# Patient Record
Sex: Male | Born: 1970 | Race: Asian | Hispanic: No | Marital: Single | Smoking: Former smoker
Health system: Southern US, Community
[De-identification: ages and names within clinical notes are randomized; demographics above are authoritative.]

## PROBLEM LIST (undated history)

## (undated) ENCOUNTER — Emergency Department (HOSPITAL_COMMUNITY): Admission: EM | Discharge status: Against Medical Advice | Payer: Self-pay

## (undated) ENCOUNTER — Emergency Department (HOSPITAL_COMMUNITY): Payer: Self-pay | Source: Home / Self Care

## (undated) DIAGNOSIS — Z789 Other specified health status: Secondary | ICD-10-CM

## (undated) DIAGNOSIS — N200 Calculus of kidney: Secondary | ICD-10-CM

## (undated) DIAGNOSIS — T7840XA Allergy, unspecified, initial encounter: Secondary | ICD-10-CM

## (undated) DIAGNOSIS — E785 Hyperlipidemia, unspecified: Secondary | ICD-10-CM

## (undated) DIAGNOSIS — I10 Essential (primary) hypertension: Secondary | ICD-10-CM

## (undated) HISTORY — DX: Essential (primary) hypertension: I10

## (undated) HISTORY — DX: Allergy, unspecified, initial encounter: T78.40XA

## (undated) HISTORY — DX: Calculus of kidney: N20.0

## (undated) HISTORY — PX: LITHOTRIPSY: SUR834

## (undated) HISTORY — DX: Hyperlipidemia, unspecified: E78.5

## (undated) HISTORY — PX: POLYPECTOMY: SHX149

---

## 2001-07-20 ENCOUNTER — Emergency Department (HOSPITAL_COMMUNITY): Admission: EM | Admit: 2001-07-20 | Discharge: 2001-07-21 | Payer: Self-pay | Admitting: Emergency Medicine

## 2009-04-08 ENCOUNTER — Emergency Department (HOSPITAL_COMMUNITY): Admission: EM | Admit: 2009-04-08 | Discharge: 2009-04-08 | Payer: Self-pay | Admitting: Emergency Medicine

## 2009-04-19 ENCOUNTER — Emergency Department (HOSPITAL_COMMUNITY): Admission: EM | Admit: 2009-04-19 | Discharge: 2009-04-20 | Payer: Self-pay | Admitting: Emergency Medicine

## 2009-05-22 ENCOUNTER — Emergency Department (HOSPITAL_COMMUNITY): Admission: EM | Admit: 2009-05-22 | Discharge: 2009-05-22 | Payer: Self-pay | Admitting: Emergency Medicine

## 2009-05-22 ENCOUNTER — Ambulatory Visit (HOSPITAL_COMMUNITY): Admission: RE | Admit: 2009-05-22 | Discharge: 2009-05-22 | Payer: Self-pay | Admitting: Urology

## 2009-05-30 ENCOUNTER — Ambulatory Visit (HOSPITAL_COMMUNITY): Admission: RE | Admit: 2009-05-30 | Discharge: 2009-05-30 | Payer: Self-pay | Admitting: Urology

## 2010-08-05 LAB — URINALYSIS, ROUTINE W REFLEX MICROSCOPIC
Bilirubin Urine: NEGATIVE
Glucose, UA: NEGATIVE mg/dL
Ketones, ur: NEGATIVE mg/dL
Leukocytes, UA: NEGATIVE
Nitrite: NEGATIVE
Protein, ur: NEGATIVE mg/dL
Specific Gravity, Urine: 1.018 (ref 1.005–1.030)
Urobilinogen, UA: 0.2 mg/dL (ref 0.0–1.0)
pH: 6 (ref 5.0–8.0)

## 2010-08-05 LAB — URINE MICROSCOPIC-ADD ON

## 2010-08-05 LAB — URINE CULTURE
Colony Count: NO GROWTH
Culture: NO GROWTH

## 2010-08-06 LAB — BASIC METABOLIC PANEL
BUN: 15 mg/dL (ref 6–23)
CO2: 25 meq/L (ref 19–32)
Calcium: 7.7 mg/dL — ABNORMAL LOW (ref 8.4–10.5)
Chloride: 109 meq/L (ref 96–112)
Creatinine, Ser: 0.72 mg/dL (ref 0.4–1.5)
GFR calc Af Amer: >60 mL/min (ref >60)
GFR calc non Af Amer: >60 mL/min (ref >60)
Glucose, Bld: 108 mg/dL — ABNORMAL HIGH (ref 70–99)
Potassium: 3.4 meq/L — ABNORMAL LOW (ref 3.5–5.1)
Sodium: 139 meq/L (ref 135–145)

## 2010-08-06 LAB — URINE MICROSCOPIC-ADD ON

## 2010-08-06 LAB — DIFFERENTIAL
Basophils Absolute: 0 10*3/uL (ref 0.0–0.1)
Basophils Relative: 0 % (ref 0–1)
Eosinophils Absolute: 0.2 10*3/uL (ref 0.0–0.7)
Eosinophils Relative: 5 % (ref 0–5)
Lymphocytes Relative: 27 % (ref 12–46)
Lymphs Abs: 1.2 10*3/uL (ref 0.7–4.0)
Monocytes Absolute: 0.4 10*3/uL (ref 0.1–1.0)
Monocytes Relative: 9 % (ref 3–12)
Neutro Abs: 2.6 10*3/uL (ref 1.7–7.7)
Neutrophils Relative %: 58 % (ref 43–77)

## 2010-08-06 LAB — URINALYSIS, ROUTINE W REFLEX MICROSCOPIC
Bilirubin Urine: NEGATIVE
Glucose, UA: NEGATIVE mg/dL
Ketones, ur: 15 mg/dL — AB
Leukocytes, UA: NEGATIVE
Nitrite: NEGATIVE
Protein, ur: 30 mg/dL — AB
Specific Gravity, Urine: 1.029 (ref 1.005–1.030)
Urobilinogen, UA: 1 mg/dL (ref 0.0–1.0)
pH: 6 (ref 5.0–8.0)

## 2010-08-06 LAB — CBC
HCT: 38.6 % — ABNORMAL LOW (ref 39.0–52.0)
Hemoglobin: 13.5 g/dL (ref 13.0–17.0)
MCHC: 34.9 g/dL (ref 30.0–36.0)
MCV: 94.3 fL (ref 78.0–100.0)
Platelets: 287 10*3/uL (ref 150–400)
RBC: 4.09 MIL/uL — ABNORMAL LOW (ref 4.22–5.81)
RDW: 13.4 % (ref 11.5–15.5)
WBC: 4.5 10*3/uL (ref 4.0–10.5)

## 2012-08-13 ENCOUNTER — Encounter: Payer: Self-pay | Admitting: Obstetrics and Gynecology

## 2013-02-20 ENCOUNTER — Encounter (HOSPITAL_COMMUNITY): Payer: Self-pay | Admitting: Emergency Medicine

## 2013-02-20 ENCOUNTER — Observation Stay (HOSPITAL_COMMUNITY)
Admission: EM | Admit: 2013-02-20 | Discharge: 2013-02-21 | Disposition: A | Discharge status: Home / Self Care | Payer: BC Managed Care – PPO | Attending: Internal Medicine | Admitting: Internal Medicine

## 2013-02-20 ENCOUNTER — Emergency Department (HOSPITAL_COMMUNITY): Payer: BC Managed Care – PPO

## 2013-02-20 DIAGNOSIS — Z23 Encounter for immunization: Secondary | ICD-10-CM | POA: Insufficient documentation

## 2013-02-20 DIAGNOSIS — R0602 Shortness of breath: Secondary | ICD-10-CM | POA: Insufficient documentation

## 2013-02-20 DIAGNOSIS — R6510 Systemic inflammatory response syndrome (SIRS) of non-infectious origin without acute organ dysfunction: Secondary | ICD-10-CM

## 2013-02-20 DIAGNOSIS — R002 Palpitations: Secondary | ICD-10-CM | POA: Diagnosis present

## 2013-02-20 DIAGNOSIS — R55 Syncope and collapse: Secondary | ICD-10-CM | POA: Insufficient documentation

## 2013-02-20 DIAGNOSIS — E876 Hypokalemia: Secondary | ICD-10-CM | POA: Insufficient documentation

## 2013-02-20 DIAGNOSIS — D72829 Elevated white blood cell count, unspecified: Secondary | ICD-10-CM | POA: Insufficient documentation

## 2013-02-20 DIAGNOSIS — F10929 Alcohol use, unspecified with intoxication, unspecified: Secondary | ICD-10-CM

## 2013-02-20 DIAGNOSIS — R079 Chest pain, unspecified: Principal | ICD-10-CM | POA: Insufficient documentation

## 2013-02-20 HISTORY — DX: Other specified health status: Z78.9

## 2013-02-20 LAB — CBC
HCT: 40.3 % (ref 39.0–52.0)
Hemoglobin: 14.8 g/dL (ref 13.0–17.0)
MCH: 31.7 pg (ref 26.0–34.0)
MCHC: 36.7 g/dL — ABNORMAL HIGH (ref 30.0–36.0)
MCV: 86.3 fL (ref 78.0–100.0)
Platelets: 363 10*3/uL (ref 150–400)
RBC: 4.67 MIL/uL (ref 4.22–5.81)
RDW: 12.5 % (ref 11.5–15.5)
WBC: 13.7 10*3/uL — ABNORMAL HIGH (ref 4.0–10.5)

## 2013-02-20 NOTE — ED Notes (Signed)
Pt. reports left chest pain with SOB , occasional dry cough and nausea onset this evening , drank ETOH this evening .

## 2013-02-21 ENCOUNTER — Encounter (HOSPITAL_COMMUNITY): Payer: Self-pay | Admitting: Internal Medicine

## 2013-02-21 DIAGNOSIS — R002 Palpitations: Secondary | ICD-10-CM | POA: Diagnosis present

## 2013-02-21 DIAGNOSIS — R55 Syncope and collapse: Secondary | ICD-10-CM | POA: Diagnosis present

## 2013-02-21 DIAGNOSIS — R079 Chest pain, unspecified: Principal | ICD-10-CM | POA: Diagnosis present

## 2013-02-21 DIAGNOSIS — R072 Precordial pain: Secondary | ICD-10-CM

## 2013-02-21 DIAGNOSIS — F1010 Alcohol abuse, uncomplicated: Secondary | ICD-10-CM

## 2013-02-21 DIAGNOSIS — E876 Hypokalemia: Secondary | ICD-10-CM

## 2013-02-21 LAB — CBC WITH DIFFERENTIAL/PLATELET
Basophils Absolute: 0 10*3/uL (ref 0.0–0.1)
Basophils Relative: 0 % (ref 0–1)
Eosinophils Absolute: 0.2 10*3/uL (ref 0.0–0.7)
Eosinophils Relative: 2 % (ref 0–5)
HCT: 36.2 % — ABNORMAL LOW (ref 39.0–52.0)
Hemoglobin: 13.1 g/dL (ref 13.0–17.0)
Lymphocytes Relative: 25 % (ref 12–46)
Lymphs Abs: 2.3 10*3/uL (ref 0.7–4.0)
MCH: 31.4 pg (ref 26.0–34.0)
MCHC: 36.2 g/dL — ABNORMAL HIGH (ref 30.0–36.0)
MCV: 86.8 fL (ref 78.0–100.0)
Monocytes Absolute: 0.6 10*3/uL (ref 0.1–1.0)
Monocytes Relative: 6 % (ref 3–12)
Neutro Abs: 6 10*3/uL (ref 1.7–7.7)
Neutrophils Relative %: 66 % (ref 43–77)
Platelets: 292 10*3/uL (ref 150–400)
RBC: 4.17 MIL/uL — ABNORMAL LOW (ref 4.22–5.81)
RDW: 12.8 % (ref 11.5–15.5)
WBC: 9 10*3/uL (ref 4.0–10.5)

## 2013-02-21 LAB — RAPID URINE DRUG SCREEN, HOSP PERFORMED
Amphetamines: NOT DETECTED
Barbiturates: NOT DETECTED
Benzodiazepines: NOT DETECTED
Cocaine: NOT DETECTED
Opiates: POSITIVE — AB
Tetrahydrocannabinol: NOT DETECTED

## 2013-02-21 LAB — TROPONIN I
Troponin I: <0.3 ng/mL (ref <0.30)
Troponin I: <0.3 ng/mL (ref <0.30)
Troponin I: <0.3 ng/mL (ref <0.30)
Troponin I: <0.3 ng/mL (ref <0.30)

## 2013-02-21 LAB — URINALYSIS, ROUTINE W REFLEX MICROSCOPIC
Bilirubin Urine: NEGATIVE
Glucose, UA: NEGATIVE mg/dL
Hgb urine dipstick: NEGATIVE
Ketones, ur: 15 mg/dL — AB
Leukocytes, UA: NEGATIVE
Nitrite: NEGATIVE
Protein, ur: NEGATIVE mg/dL
Specific Gravity, Urine: 1.014 (ref 1.005–1.030)
Urobilinogen, UA: 0.2 mg/dL (ref 0.0–1.0)
pH: 6 (ref 5.0–8.0)

## 2013-02-21 LAB — D-DIMER, QUANTITATIVE: D-Dimer, Quant: <0.27 ug{FEU}/mL (ref 0.00–0.48)

## 2013-02-21 LAB — TSH: TSH: 0.819 u[IU]/mL (ref 0.350–4.500)

## 2013-02-21 LAB — BASIC METABOLIC PANEL
BUN: 21 mg/dL (ref 6–23)
CO2: 22 meq/L (ref 19–32)
Calcium: 8.9 mg/dL (ref 8.4–10.5)
Chloride: 100 meq/L (ref 96–112)
Creatinine, Ser: 1.09 mg/dL (ref 0.50–1.35)
GFR calc Af Amer: >90 mL/min (ref >90)
GFR calc non Af Amer: 81 mL/min — ABNORMAL LOW (ref >90)
Glucose, Bld: 133 mg/dL — ABNORMAL HIGH (ref 70–99)
Potassium: 3 meq/L — ABNORMAL LOW (ref 3.5–5.1)
Sodium: 138 meq/L (ref 135–145)

## 2013-02-21 LAB — PRO B NATRIURETIC PEPTIDE: Pro B Natriuretic peptide (BNP): 26.7 pg/mL (ref 0–125)

## 2013-02-21 LAB — COMPREHENSIVE METABOLIC PANEL
ALT: 22 U/L (ref 0–53)
AST: 19 U/L (ref 0–37)
Albumin: 3.3 g/dL — ABNORMAL LOW (ref 3.5–5.2)
Alkaline Phosphatase: 77 U/L (ref 39–117)
BUN: 17 mg/dL (ref 6–23)
CO2: 20 meq/L (ref 19–32)
Calcium: 8.1 mg/dL — ABNORMAL LOW (ref 8.4–10.5)
Chloride: 104 meq/L (ref 96–112)
Creatinine, Ser: 0.8 mg/dL (ref 0.50–1.35)
GFR calc Af Amer: >90 mL/min (ref >90)
GFR calc non Af Amer: >90 mL/min (ref >90)
Glucose, Bld: 102 mg/dL — ABNORMAL HIGH (ref 70–99)
Potassium: 3.5 meq/L (ref 3.5–5.1)
Sodium: 137 meq/L (ref 135–145)
Total Bilirubin: 0.3 mg/dL (ref 0.3–1.2)
Total Protein: 6.3 g/dL (ref 6.0–8.3)

## 2013-02-21 LAB — ETHANOL: Alcohol, Ethyl (B): 119 mg/dL — ABNORMAL HIGH (ref 0–11)

## 2013-02-21 LAB — MAGNESIUM: Magnesium: 1.7 mg/dL (ref 1.5–2.5)

## 2013-02-21 LAB — CG4 I-STAT (LACTIC ACID): Lactic Acid, Venous: 2.35 mmol/L — ABNORMAL HIGH (ref 0.5–2.2)

## 2013-02-21 LAB — LACTIC ACID, PLASMA: Lactic Acid, Venous: 1.9 mmol/L (ref 0.5–2.2)

## 2013-02-21 LAB — LIPASE, BLOOD: Lipase: 28 U/L (ref 11–59)

## 2013-02-21 MED ORDER — POTASSIUM CHLORIDE IN NACL 20-0.9 MEQ/L-% IV SOLN
INTRAVENOUS | Status: DC
  Administered 2013-02-21: 04:00:00 via INTRAVENOUS
  Filled 2013-02-21 (×2): qty 1000

## 2013-02-21 MED ORDER — POTASSIUM CHLORIDE CRYS ER 20 MEQ PO TBCR
40.0000 meq | EXTENDED_RELEASE_TABLET | Freq: Once | ORAL | Status: AC
  Administered 2013-02-21: 40 meq via ORAL
  Filled 2013-02-21: qty 2

## 2013-02-21 MED ORDER — INFLUENZA VAC SPLIT QUAD 0.5 ML IM SUSP: 0.5000 mL | INTRAMUSCULAR | Status: DC

## 2013-02-21 MED ORDER — ONDANSETRON HCL 4 MG/2ML IJ SOLN: 4.0000 mg | Freq: Four times a day (QID) | INTRAMUSCULAR | Status: DC | PRN

## 2013-02-21 MED ORDER — ACETAMINOPHEN 325 MG PO TABS: 650.0000 mg | ORAL_TABLET | Freq: Four times a day (QID) | ORAL | Status: AC | PRN

## 2013-02-21 MED ORDER — INFLUENZA VAC SPLIT QUAD 0.5 ML IM SUSP
0.5000 mL | INTRAMUSCULAR | Status: AC
  Administered 2013-02-21: 0.5 mL via INTRAMUSCULAR
  Filled 2013-02-21: qty 0.5

## 2013-02-21 MED ORDER — ONDANSETRON HCL 4 MG PO TABS: 4.0000 mg | ORAL_TABLET | Freq: Four times a day (QID) | ORAL | Status: DC | PRN

## 2013-02-21 MED ORDER — POTASSIUM CHLORIDE ER 10 MEQ PO TBCR: 20.0000 meq | EXTENDED_RELEASE_TABLET | Freq: Every day | ORAL | Status: DC

## 2013-02-21 MED ORDER — ASPIRIN EC 325 MG PO TBEC
325.0000 mg | DELAYED_RELEASE_TABLET | Freq: Every day | ORAL | Status: DC
  Administered 2013-02-21: 325 mg via ORAL
  Filled 2013-02-21: qty 1

## 2013-02-21 MED ORDER — ASPIRIN EC 81 MG PO TBEC: 81.0000 mg | DELAYED_RELEASE_TABLET | Freq: Every day | ORAL | Status: DC

## 2013-02-21 MED ORDER — ACETAMINOPHEN 650 MG RE SUPP: 650.0000 mg | Freq: Four times a day (QID) | RECTAL | Status: DC | PRN

## 2013-02-21 MED ORDER — MORPHINE SULFATE 2 MG/ML IJ SOLN
2.0000 mg | Freq: Once | INTRAMUSCULAR | Status: AC
  Administered 2013-02-21: 2 mg via INTRAVENOUS
  Filled 2013-02-21: qty 1

## 2013-02-21 MED ORDER — SODIUM CHLORIDE 0.9 % IV BOLUS (SEPSIS)
1000.0000 mL | Freq: Once | INTRAVENOUS | Status: AC
  Administered 2013-02-21: 1000 mL via INTRAVENOUS

## 2013-02-21 MED ORDER — ASPIRIN 81 MG PO CHEW
324.0000 mg | CHEWABLE_TABLET | Freq: Once | ORAL | Status: AC
  Administered 2013-02-21: 324 mg via ORAL
  Filled 2013-02-21: qty 4

## 2013-02-21 MED ORDER — LORATADINE 10 MG PO TABS
10.0000 mg | ORAL_TABLET | Freq: Every day | ORAL | Status: DC
  Administered 2013-02-21: 10 mg via ORAL
  Filled 2013-02-21: qty 1

## 2013-02-21 MED ORDER — SODIUM CHLORIDE 0.9 % IJ SOLN
3.0000 mL | Freq: Two times a day (BID) | INTRAMUSCULAR | Status: DC
  Administered 2013-02-21 (×2): 3 mL via INTRAVENOUS

## 2013-02-21 MED ORDER — ACETAMINOPHEN 325 MG PO TABS: 650.0000 mg | ORAL_TABLET | Freq: Four times a day (QID) | ORAL | Status: DC | PRN

## 2013-02-21 MED ORDER — MORPHINE SULFATE 2 MG/ML IJ SOLN: 1.0000 mg | INTRAMUSCULAR | Status: DC | PRN

## 2013-02-21 NOTE — ED Notes (Signed)
Transporting patient to new room assignment. 

## 2013-02-21 NOTE — Progress Notes (Signed)
  Echocardiogram 2D Echocardiogram has been performed.  Cathie Beams 02/21/2013, 12:50 PM

## 2013-02-21 NOTE — H&P (Signed)
[facility] History and Physical  Todd Clayton WUJ:[id number] DOB: 1970/12/13 DOA: 02/20/2013  Referring physician: ER physician. PCP: No primary provider on file.   Chief Complaint: Chest pain and loss of consciousness.  HPI: Todd Clayton is a 42 y.o. male with no significant past history was brought to the ER after patient had a spell of loss of consciousness. Patient last night was having alcohol with his friends after the friends left patient had complained of chest pain to his wife. The pain was stabbing in nature radiating to the back. He asked his wife to bring some water and when she went to get the water and came back she found that patient was unconscious. She immediately get some chest compressions and gave some mouth-to-mouth respirations. EMS was called and by then patient regained consciousness. Patient presently chest pain-free. Cardiac markers EKG and chest x-ray were unremarkable. Since patient had this brief episode of chest pain followed by loss of consciousness and brief CPR done by his wife patient has been admitted for observation. Patient denies any nausea vomiting abdominal pain diarrhea focal deficits headache visual symptoms. Patient has not had similar symptoms previously. Patient states that he does not drink alcohol everyday.   Review of Systems: As presented in the history of presenting illness, rest negative.  Past Medical History  Diagnosis Date  . Medical history non-contributory    Past Surgical History  Procedure Laterality Date  . Lithotripsy     Social History:  reports that he has never smoked. He does not have any smokeless tobacco history on file. He reports that he drinks alcohol. He reports that he does not use illicit drugs. Where does patient live home. Can patient participate in ADLs? Yes.  No Known Allergies  Family History:  Family History  Problem Relation Age of Onset  . Cervical cancer Mother       Prior to  Admission medications   Medication Sig Start Date End Date Taking? Authorizing Provider  cetirizine (ZYRTEC) 10 MG tablet Take 10 mg by mouth daily as needed for allergies.   Yes Historical Provider, MD    Physical Exam: Filed Vitals:   02/21/13 0100 02/21/13 0115 02/21/13 0130 02/21/13 0217  BP: 120/77 122/81 127/82 113/74  Pulse: 95 96 100 95  Temp:    98.2 F (36.8 C)  TempSrc:    Oral  Resp: 23 18 21    Height:    5\' 7"  (1.702 m)  Weight:    77.9 kg (171 lb 11.8 oz)  SpO2: 97% 99% 96% 93%     General:  Well-developed and moderately nourished.  Eyes: Anicteric no pallor.  ENT: No discharge from ears eyes nose mouth.  Neck: No mass felt.  Cardiovascular: S1-S2 heard.  Respiratory: No rhonchi or crepitations.  Abdomen: Soft nontender bowel sounds present.  Skin: No rash.  Musculoskeletal: No edema.  Psychiatric: Appears normal.  Neurologic: Alert awake oriented to time place and person. Moves all extremities.  Labs on Admission:  Basic Metabolic Panel:  Recent Labs Lab 02/20/13 2316  NA 138  K 3.0*  CL 100  CO2 22  GLUCOSE 133*  BUN 21  CREATININE 1.09  CALCIUM 8.9   Liver Function Tests: No results found for this basename: AST, ALT, ALKPHOS, BILITOT, PROT, ALBUMIN,  in the last 168 hours No results found for this basename: LIPASE, AMYLASE,  in the last 168 hours No results found for this basename: AMMONIA,  in the last 168 hours CBC:  Recent Labs Lab 02/20/13 2316  WBC 13.7*  HGB 14.8  HCT 40.3  MCV 86.3  PLT 363   Cardiac Enzymes:  Recent Labs Lab 02/20/13 2317  TROPONINI <0.30    BNP (last 3 results)  Recent Labs  02/20/13 2317  PROBNP 26.7   CBG: No results found for this basename: GLUCAP,  in the last 168 hours  Radiological Exams on Admission: Dg Chest 2 View  02/20/2013   CLINICAL DATA:  Chest pain  EXAM: CHEST  2 VIEW  COMPARISON:  None.  FINDINGS: The heart size and mediastinal contours are within normal limits.  Both lungs are clear. The visualized skeletal structures are unremarkable.  IMPRESSION: No active cardiopulmonary disease.   Electronically Signed   By: Todd Clayton M.D.   On: 02/20/2013 23:51    EKG: Independently reviewed. Sinus tachycardia.  Assessment/Plan Principal Problem:   Chest pain   1. Chest pain with syncopal episode - closely monitor in telemetry. Check 2-D echo. Replace potassium and check magnesium levels. Cycle cardiac markers. 2. Mild hypokalemia - replace and recheck. 3. Leukocytosis - probably reactionary. Recheck CBC. Check UA.    Code Status: Full code.  Family Communication: Patient wife at the bedside.  Disposition Plan: Admit for observation.    Todd Clayton N. [facility] Pager [phone].  If 7PM-7AM, please contact night-coverage [email or web address] Password [email or web address] 02/21/2013, 3:04 AM

## 2013-02-21 NOTE — Discharge Summary (Signed)
Physician Discharge Summary  Todd Clayton UJW:[id number] DOB: [birth year] DOA: 02/20/2013  PCP: No primary provider on file.  Admit date: 02/20/2013 Discharge date: 02/21/2013   Recommendations for Outpatient Follow-up:  1. F/u cardiology to consider event monitor and stress test  Discharge Diagnoses:  Principal Problem:   Chest pain hypokalemia  Discharge Condition: stable  Filed Weights   02/21/13 0217  Weight: 77.9 kg (171 lb 11.8 oz)    History of present illness:  42 y.o. male with no significant past history was brought to the ER after patient had a spell of loss of consciousness. Patient last night was having alcohol with his friends after the friends left patient had complained of chest pain to his wife. The pain was stabbing in nature radiating to the back. He asked his wife to bring some water and when she went to get the water and came back she found that patient was unconscious. She immediately get some chest compressions and gave some mouth-to-mouth respirations. EMS was called and by then patient regained consciousness. Patient presently chest pain-free. Cardiac markers EKG and chest x-ray were unremarkable. Since patient had this brief episode of chest pain followed by loss of consciousness and brief CPR done by his wife patient has been admitted for observation. Patient denies any nausea vomiting abdominal pain diarrhea focal deficits headache visual symptoms. Patient has not had similar symptoms previously. Patient states that he does not drink alcohol everyday.   Hospital Course:  Observed on telemetry where he remained in NSR.  No further chest pain or syncopal episodes.  Echocardiogram showed only grade 1 diastolic dysfunction. Hypokalemia corected. MI ruled out. Have arranged f/u with cardiology as outpatient to consider stress test and event monitor  Procedures:  none  Consultations:  none  Discharge Exam: Filed Vitals:   02/21/13 0553  BP: 119/81   Pulse: 93  Temp: 97.6 F (36.4 C)  Resp: 16    General: alert. anxious Cardiovascular: RRR without MGR Respiratory: CTA without WRR  Discharge Instructions  Discharge Orders   Future Orders Complete By Expires   Activity as tolerated - No restrictions  As directed    Diet general  As directed        Medication List         acetaminophen 325 MG tablet  Commonly known as:  TYLENOL  Take 2 tablets (650 mg total) by mouth every 6 (six) hours as needed.     aspirin EC 81 MG tablet  Take 1 tablet (81 mg total) by mouth daily.     cetirizine 10 MG tablet  Commonly known as:  ZYRTEC  Take 10 mg by mouth daily as needed for allergies.     potassium chloride 10 MEQ tablet  Commonly known as:  K-DUR  Take 2 tablets (20 mEq total) by mouth daily.       No Known Allergies     Follow-up Information   Follow up with [facility]. (the office will call you with appointment time)    Contact information:   [address] [place] [address]        The results of significant diagnostics from this hospitalization (including imaging, microbiology, ancillary and laboratory) are listed below for reference.    Significant Diagnostic Studies: Dg Chest 2 View  02/20/2013   CLINICAL DATA:  Chest pain  EXAM: CHEST  2 VIEW  COMPARISON:  None.  FINDINGS: The heart size and mediastinal contours are within  normal limits. Both lungs are clear. The visualized skeletal structures are unremarkable.  IMPRESSION: No active cardiopulmonary disease.   Electronically Signed   By: Marlan Palau M.D.   On: 02/20/2013 23:51    Microbiology: No results found for this or any previous visit (from the past 240 hour(s)).   Labs: Basic Metabolic Panel:  Recent Labs Lab 02/20/13 2316 02/21/13 0500  NA 138 137  K 3.0* 3.5  CL 100 104  CO2 22 20  GLUCOSE 133* 102*  BUN 21 17  CREATININE 1.09 0.80  CALCIUM 8.9 8.1*  MG  --  1.7    Liver Function Tests:  Recent Labs Lab 02/21/13 0500  AST 19  ALT 22  ALKPHOS 77  BILITOT 0.3  PROT 6.3  ALBUMIN 3.3*    Recent Labs Lab 02/21/13 0500  LIPASE 28   No results found for this basename: AMMONIA,  in the last 168 hours CBC:  Recent Labs Lab 02/20/13 2316 02/21/13 0500  WBC 13.7* 9.0  NEUTROABS  --  6.0  HGB 14.8 13.1  HCT 40.3 36.2*  MCV 86.3 86.8  PLT 363 292   Cardiac Enzymes:  Recent Labs Lab 02/20/13 2317 02/21/13 0500 02/21/13 0840  TROPONINI <0.30 <0.30 <0.30   BNP: BNP (last 3 results)  Recent Labs  02/20/13 2317  PROBNP 26.7   CBG: No results found for this basename: GLUCAP,  in the last 168 hours  Echo Left ventricle: The cavity size was normal. Systolic function was normal. The estimated ejection fraction was in the range of 55% to 60%. Wall motion was normal; there were no regional wall motion abnormalities. Doppler parameters are consistent with abnormal left ventricular relaxation (grade 1 diastolic dysfunction).   EKG Sinus tachycardia  Signed:  Zaylei Mullane L  [facility] 02/21/2013, 2:18 PM

## 2013-02-21 NOTE — ED Provider Notes (Signed)
CSN: [id number]     Arrival date & time 02/20/13  2246 History   First MD Initiated Contact with Patient 02/21/13 0004     Chief Complaint  Patient presents with  . Chest Pain   (Consider location/radiation/quality/duration/timing/severity/associated sxs/prior Treatment) HPI This patient is a generally healthy 42 yo man who presents after an episode of unresponsiveness preceded by chest pain. The patient has had intermittent chest pain throughout the day. Pain is aching, "feels like someone is punching me - inside of me. I feel like an elephant sit on my body".  Sx come and go. Pain radiates to left upper back. 10/10 at great severity. Currently, the patient is chest pain free. But, he has 6/10 left upper back pain.   Patient notes that he has had this pain in the back for the past week. He has been taking Alleve and the pain comes and goes. It is worse with use of the left arm. He is left hand dominant. Patient has SOB associated with chest pain.   His wife says that he was laying on bed at 2030 tonight, complained of chest pain then "stopped breathing".  She describes the patient stopped breathing, eyes open, extremities flacid, patient unresponsive, skin pale. Wife did chest compressions and mouth to mouth resuscitation. This happened for about 5 minutes. Wife says he started coughing. Paramedics arrived and "he slowly waked up".   No history of similar sx. No history of chest pain. No history of tobacco use.   The patient's brother died at age 3 of cardiac arrest.  He is the youngest of 53 children and one of only two surviving. Sister thinks that the patient also has a second brother who has a history of CAD.  The patient is a [place] immigrant. He has no primary care physician. He has never had a cardiac work up.    History reviewed. No pertinent past medical history. History reviewed. No pertinent past surgical history. No family history on file. History  Substance Use Topics  .  Smoking status: Never Smoker   . Smokeless tobacco: Not on file  . Alcohol Use: Yes    Review of Systems 10 point ROS completed and is negative with the exception of sx noted above.   Allergies  Review of patient's allergies indicates no known allergies.  Home Medications   Current Outpatient Rx  Name  Route  Sig  Dispense  Refill  . cetirizine (ZYRTEC) 10 MG tablet   Oral   Take 10 mg by mouth daily as needed for allergies.          BP 115/73  Pulse 118  Temp(Src) 98 F (36.7 C) (Oral)  Resp 22  SpO2 98% Physical Exam Gen: well developed and well nourished appearing Head: NCAT Eyes: PERL, EOMI Nose: no epistaixis or rhinorrhea Mouth/throat: mucosa is moist and pink Neck: supple, no stridor Lungs: CTA B, no wheezing, rhonchi or rales CV: Regular rate and rhythm, pulse in the 90s, no murmurs, extremities well perfused Abd: soft, notender, nondistended Back: There is palpation of paraspinal musculature left upper thoracic region, no line ttp, no cva ttp Skin: no rashese, wnl Neuro: CN ii-xii grossly intact, no focal deficits Psyche; somewhat withdrawn affect,  calm and cooperative.   ED Course  Procedures (including critical care time) Labs Review  Results for orders placed during the hospital encounter of 02/20/13 (from the past 24 hour(s))  CBC     Status: Abnormal   Collection Time  02/20/13 11:16 PM      Result Value Range   WBC 13.7 (*) 4.0 - 10.5 K/uL   RBC 4.67  4.22 - 5.81 MIL/uL   Hemoglobin 14.8  13.0 - 17.0 g/dL   HCT 45.4  09.8 - 11.9 %   MCV 86.3  78.0 - 100.0 fL   MCH 31.7  26.0 - 34.0 pg   MCHC 36.7 (*) 30.0 - 36.0 g/dL   RDW 14.7  82.9 - 56.2 %   Platelets 363  150 - 400 K/uL  BASIC METABOLIC PANEL     Status: Abnormal   Collection Time    02/20/13 11:16 PM      Result Value Range   Sodium 138  135 - 145 mEq/L   Potassium 3.0 (*) 3.5 - 5.1 mEq/L   Chloride 100  96 - 112 mEq/L   CO2 22  19 - 32 mEq/L   Glucose, Bld 133 (*) 70 - 99  mg/dL   BUN 21  6 - 23 mg/dL   Creatinine, Ser 1.30  0.50 - 1.35 mg/dL   Calcium 8.9  8.4 - 86.5 mg/dL   GFR calc non Af Amer 81 (*) >90 mL/min   GFR calc Af Amer >90  >90 mL/min  ETHANOL     Status: Abnormal   Collection Time    02/20/13 11:16 PM      Result Value Range   Alcohol, Ethyl (B) 119 (*) 0 - 11 mg/dL  PRO B NATRIURETIC PEPTIDE     Status: None   Collection Time    02/20/13 11:17 PM      Result Value Range   Pro B Natriuretic peptide (BNP) 26.7  0 - 125 pg/mL  TROPONIN I     Status: None   Collection Time    02/20/13 11:17 PM      Result Value Range   Troponin I <0.30  <0.30 ng/mL    Imaging Review Dg Chest 2 View  02/20/2013   CLINICAL DATA:  Chest pain  EXAM: CHEST  2 VIEW  COMPARISON:  None.  FINDINGS: The heart size and mediastinal contours are within normal limits. Both lungs are clear. The visualized skeletal structures are unremarkable.  IMPRESSION: No active cardiopulmonary disease.   Electronically Signed   By: Marlan Palau M.D.   On: 02/20/2013 23:51    EKG Interpretation   None      Sinus tach, normal axis, normal intervals, normal qrs, no acute icshemic changes noted  MDM  DDX: ACS, pneumothorax, pneumonia, pericardial or pleural effusion, gastritis, GERD/PUD, musculoskeletal pain.   Emergency department work up is thus far nondiagnostic but, reassuring from a cardiac perspective.  First troponin is negative - 4 hrs after event concerning to wife for cardiac arrest. Patient initially mildly tachycardic - improving with IVF and MS 2mg  IV for pain. Tx with ASA. D-dimer pending to help exclude PE. CXR excludes ptx, pleural effusion, pna. Abdominal exam is benign. We are treating hypokalemia.   Case discussed with Dr. Toniann Fail who has accepted the patient as an observation admission.     Todd Loosen, MD 02/21/13 0111

## 2015-03-16 ENCOUNTER — Emergency Department (HOSPITAL_COMMUNITY)
Admission: EM | Admit: 2015-03-16 | Discharge: 2015-03-17 | Disposition: A | Discharge status: Home / Self Care | Payer: BLUE CROSS/BLUE SHIELD | Attending: Emergency Medicine | Admitting: Emergency Medicine

## 2015-03-16 DIAGNOSIS — Z7982 Long term (current) use of aspirin: Secondary | ICD-10-CM | POA: Insufficient documentation

## 2015-03-16 DIAGNOSIS — N200 Calculus of kidney: Secondary | ICD-10-CM | POA: Insufficient documentation

## 2015-03-16 DIAGNOSIS — Z79891 Long term (current) use of opiate analgesic: Secondary | ICD-10-CM | POA: Insufficient documentation

## 2015-03-16 DIAGNOSIS — IMO0001 Reserved for inherently not codable concepts without codable children: Secondary | ICD-10-CM

## 2015-03-16 DIAGNOSIS — K5900 Constipation, unspecified: Secondary | ICD-10-CM | POA: Diagnosis not present

## 2015-03-16 DIAGNOSIS — Z79899 Other long term (current) drug therapy: Secondary | ICD-10-CM | POA: Insufficient documentation

## 2015-03-16 DIAGNOSIS — R1030 Lower abdominal pain, unspecified: Secondary | ICD-10-CM | POA: Diagnosis present

## 2015-03-16 HISTORY — DX: Calculus of kidney: N20.0

## 2015-03-17 ENCOUNTER — Emergency Department (HOSPITAL_COMMUNITY): Payer: BLUE CROSS/BLUE SHIELD

## 2015-03-17 ENCOUNTER — Encounter (HOSPITAL_COMMUNITY): Payer: Self-pay | Admitting: Emergency Medicine

## 2015-03-17 LAB — URINALYSIS, ROUTINE W REFLEX MICROSCOPIC
Bilirubin Urine: NEGATIVE
Glucose, UA: NEGATIVE mg/dL
Ketones, ur: NEGATIVE mg/dL
Nitrite: NEGATIVE
Protein, ur: 30 mg/dL — AB
Specific Gravity, Urine: 1.028 (ref 1.005–1.030)
Urobilinogen, UA: 0.2 mg/dL (ref 0.0–1.0)
pH: 5.5 (ref 5.0–8.0)

## 2015-03-17 LAB — I-STAT CHEM 8, ED
BUN: 17 mg/dL (ref 6–20)
Calcium, Ion: 1.11 mmol/L — ABNORMAL LOW (ref 1.12–1.23)
Chloride: 98 mmol/L — ABNORMAL LOW (ref 101–111)
Creatinine, Ser: 1.2 mg/dL (ref 0.61–1.24)
Glucose, Bld: 111 mg/dL — ABNORMAL HIGH (ref 65–99)
HCT: 42 % (ref 39.0–52.0)
Hemoglobin: 14.3 g/dL (ref 13.0–17.0)
Potassium: 3.6 mmol/L (ref 3.5–5.1)
Sodium: 135 mmol/L (ref 135–145)
TCO2: 24 mmol/L (ref 0–100)

## 2015-03-17 LAB — URINE MICROSCOPIC-ADD ON

## 2015-03-17 LAB — CBC WITH DIFFERENTIAL/PLATELET
Basophils Absolute: 0 K/uL (ref 0.0–0.1)
Basophils Relative: 0 %
Eosinophils Absolute: 0.1 K/uL (ref 0.0–0.7)
Eosinophils Relative: 2 %
HCT: 39 % (ref 39.0–52.0)
Hemoglobin: 13.8 g/dL (ref 13.0–17.0)
Lymphocytes Relative: 20 %
Lymphs Abs: 1.5 K/uL (ref 0.7–4.0)
MCH: 30.8 pg (ref 26.0–34.0)
MCHC: 35.4 g/dL (ref 30.0–36.0)
MCV: 87.1 fL (ref 78.0–100.0)
Monocytes Absolute: 0.6 K/uL (ref 0.1–1.0)
Monocytes Relative: 8 %
Neutro Abs: 5.2 K/uL (ref 1.7–7.7)
Neutrophils Relative %: 70 %
Platelets: 277 K/uL (ref 150–400)
RBC: 4.48 MIL/uL (ref 4.22–5.81)
RDW: 12.4 % (ref 11.5–15.5)
WBC: 7.4 K/uL (ref 4.0–10.5)

## 2015-03-17 MED ORDER — KETOROLAC TROMETHAMINE 30 MG/ML IJ SOLN
30.0000 mg | Freq: Once | INTRAMUSCULAR | Status: AC
  Administered 2015-03-17: 30 mg via INTRAVENOUS
  Filled 2015-03-17: qty 1

## 2015-03-17 MED ORDER — MELOXICAM 7.5 MG PO TABS: 7.5000 mg | ORAL_TABLET | Freq: Every day | ORAL | Status: DC

## 2015-03-17 MED ORDER — KETOROLAC TROMETHAMINE 60 MG/2ML IM SOLN: 60.0000 mg | Freq: Once | INTRAMUSCULAR | Status: DC

## 2015-03-17 MED ORDER — SODIUM CHLORIDE 0.9 % IV BOLUS (SEPSIS)
500.0000 mL | Freq: Once | INTRAVENOUS | Status: AC
  Administered 2015-03-17: 500 mL via INTRAVENOUS

## 2015-03-17 NOTE — ED Provider Notes (Signed)
CSN: [id number]     Arrival date & time 03/16/15  2350 History  By signing my name below, I, Emmanuella Mensah, attest that this documentation has been prepared under the direction and in the presence of Dearra Myhand, MD. Electronically Signed: Angelene Giovanni, ED Scribe. 03/17/2015. 3:28 AM.     Chief Complaint  Patient presents with  . Flank Pain    urine retention   Patient is a 44 y.o. male presenting with flank pain. The history is provided by the patient. No language interpreter was used.  Flank Pain This is a new problem. The current episode started more than 1 week ago. The problem occurs constantly. The problem has been gradually worsening. Associated symptoms include abdominal pain. Nothing aggravates the symptoms. Nothing relieves the symptoms. Treatments tried: Oxycodone and Cipro. The treatment provided no relief.   HPI Comments: Level 5 Caveat due to poor historian Todd Clayton is a 44 y.o. male with a hx of kidney stones who presents to the Emergency Department complaining of dysuria and gradually worsening right lower back pain onset 02/24/15. He reports associated lower abdominal pain and constipation. He denies any fever, chills, or n/v/d. He reports that his last void was about 5 hours ago. He reports that he went to a hospital in [place], [place] on 02/24/15 and diagnosed with kidney stones. He was then advised to follow up with a Urologist but has not followed up yet but adds that he has an upcoming appointment. He states that he went to Urgent Care where he received Cipro on 03/06/15. He reports that he has been taking the Oxycodone he was prescribed in [place] for pain. Pt has been non-compliant with his Cipro by taking it once a day instead of twice a day.   Past Medical History  Diagnosis Date  . Medical history non-contributory   . Kidney stones    Past Surgical History  Procedure Laterality Date  . Lithotripsy     Family History  Problem Relation  Age of Onset  . Cervical cancer Mother    Social History  Substance Use Topics  . Smoking status: Never Smoker   . Smokeless tobacco: None  . Alcohol Use: Yes    Review of Systems  Constitutional: Negative for fever and chills.  Gastrointestinal: Positive for abdominal pain and constipation. Negative for nausea, vomiting and diarrhea.  Genitourinary: Positive for dysuria, flank pain and difficulty urinating.  All other systems reviewed and are negative.     Allergies  Review of patient's allergies indicates no known allergies.  Home Medications   Prior to Admission medications   Medication Sig Start Date End Date Taking? Authorizing Provider  acetaminophen (TYLENOL) 325 MG tablet Take 2 tablets (650 mg total) by mouth every 6 (six) hours as needed. 02/21/13   Christiane Ha, MD  aspirin EC 81 MG tablet Take 1 tablet (81 mg total) by mouth daily. 02/21/13   Christiane Ha, MD  cetirizine (ZYRTEC) 10 MG tablet Take 10 mg by mouth daily as needed for allergies.    Historical Provider, MD  potassium chloride (K-DUR) 10 MEQ tablet Take 2 tablets (20 mEq total) by mouth daily. 02/21/13   Christiane Ha, MD   BP 146/102 mmHg  Pulse 80  Temp(Src) 97.6 F (36.4 C) (Oral)  Resp 14  Ht  (1.702 m)  Wt 150 lb (68.04 kg)  BMI 23.49 kg/m2  SpO2 100% Physical Exam  Constitutional: He is oriented to person, place, and time. He  appears well-developed and well-nourished. No distress.  HENT:  Head: Normocephalic and atraumatic.  Mouth/Throat: Oropharynx is clear and moist.  Eyes: Conjunctivae and EOM are normal. Pupils are equal, round, and reactive to light.  Neck: Normal range of motion. Neck supple. No tracheal deviation present.  Cardiovascular: Normal rate.   Pulmonary/Chest: Effort normal. No respiratory distress.  Abdominal: Soft. He exhibits no mass. There is no tenderness. There is no rebound and no guarding.  extremely gassy  Hyperactive bowel sounds   Musculoskeletal: Normal range of motion.  Neurological: He is alert and oriented to person, place, and time.  Good DTRs  Skin: Skin is warm and dry.  Psychiatric: He has a normal mood and affect. His behavior is normal.  Nursing note and vitals reviewed.   ED Course  Procedures (including critical care time) DIAGNOSTIC STUDIES: Oxygen Saturation is 100% on RA, normal by my interpretation.    COORDINATION OF CARE: 12:36 AM- Pt advised of plan for treatment and pt agrees. Will obtain records from [place], [place] for further evaluation.   3:25 AM - Pt recommended to call Urology Monday morning for an appointment. Advised to continue the Flomax once a day and to be compliant with his Cipro, twice a day.    Labs Review Labs Reviewed - No data to display  Imaging Review No results found.   Halee Glynn, MD has personally reviewed and evaluated these images and lab results as part of her medical decision-making.   EKG Interpretation None      MDM   Final diagnoses:  None   Results for orders placed or performed during the hospital encounter of 03/16/15  CBC with Differential/Platelet  Result Value Ref Range   WBC 7.4 4.0 - 10.5 K/uL   RBC 4.48 4.22 - 5.81 MIL/uL   Hemoglobin 13.8 13.0 - 17.0 g/dL   HCT 11.9 14.7 - 82.9 %   MCV 87.1 78.0 - 100.0 fL   MCH 30.8 26.0 - 34.0 pg   MCHC 35.4 30.0 - 36.0 g/dL   RDW 56.2 13.0 - 86.5 %   Platelets 277 150 - 400 K/uL   Neutrophils Relative % 70 %   Neutro Abs 5.2 1.7 - 7.7 K/uL   Lymphocytes Relative 20 %   Lymphs Abs 1.5 0.7 - 4.0 K/uL   Monocytes Relative 8 %   Monocytes Absolute 0.6 0.1 - 1.0 K/uL   Eosinophils Relative 2 %   Eosinophils Absolute 0.1 0.0 - 0.7 K/uL   Basophils Relative 0 %   Basophils Absolute 0.0 0.0 - 0.1 K/uL  Urinalysis, Routine w reflex microscopic (not at [facility])  Result Value Ref Range   Color, Urine RED (A) YELLOW   APPearance CLOUDY (A) CLEAR   Specific Gravity, Urine 1.028 1.005 - 1.030    pH 5.5 5.0 - 8.0   Glucose, UA NEGATIVE NEGATIVE mg/dL   Hgb urine dipstick LARGE (A) NEGATIVE   Bilirubin Urine NEGATIVE NEGATIVE   Ketones, ur NEGATIVE NEGATIVE mg/dL   Protein, ur 30 (A) NEGATIVE mg/dL   Urobilinogen, UA 0.2 0.0 - 1.0 mg/dL   Nitrite NEGATIVE NEGATIVE   Leukocytes, UA TRACE (A) NEGATIVE  Urine microscopic-add on  Result Value Ref Range   Squamous Epithelial / LPF RARE RARE   WBC, UA 0-2 <3 WBC/hpf   RBC / HPF TOO NUMEROUS TO COUNT <3 RBC/hpf   Bacteria, UA RARE RARE  I-Stat Chem 8, ED  Result Value Ref Range   Sodium 135 135 - 145 mmol/L  Potassium 3.6 3.5 - 5.1 mmol/L   Chloride 98 (L) 101 - 111 mmol/L   BUN 17 6 - 20 mg/dL   Creatinine, Ser 1.611.20 0.61 - 1.24 mg/dL   Glucose, Bld 096111 (H) 65 - 99 mg/dL   Calcium, Ion 0.451.11 (L) 1.12 - 1.23 mmol/L   TCO2 24 0 - 100 mmol/L   Hemoglobin 14.3 13.0 - 17.0 g/dL   HCT 40.942.0 81.139.0 - 91.452.0 %   Ct Renal Stone Study  03/17/2015  CLINICAL DATA:  Inability to urinate for 6 hours, with right lower back pain, radiating to the abdomen. Initial encounter. EXAM: CT ABDOMEN AND PELVIS WITHOUT CONTRAST TECHNIQUE: Multidetector CT imaging of the abdomen and pelvis was performed following the standard protocol without IV contrast. COMPARISON:  CT of the abdomen and pelvis performed 05/22/2009 FINDINGS: Minimal bibasilar atelectasis is noted. A 2.8 cm hepatic cyst is noted adjacent to the gallbladder fossa. A few tiny nonspecific hypodensities are seen within the liver. The liver and spleen are otherwise unremarkable. The gallbladder is within normal limits. The pancreas and adrenal glands are unremarkable. There is minimal right-sided hydronephrosis, with right-sided perinephric stranding and fluid, and diffuse prominence of the right ureter to the level of an obstructing 7 x 4 mm stone in the distal right ureter, just above the right vesicoureteral junction. Minimal nonspecific left-sided perinephric stranding is seen. No nonobstructing renal  stones are identified. No free fluid is identified. The small bowel is unremarkable in appearance. The stomach is within normal limits. No acute vascular abnormalities are seen. Minimal calcification is noted along the distal abdominal aorta. The appendix is normal in caliber, without evidence of appendicitis. The colon is unremarkable in appearance. The bladder is mildly distended and grossly unremarkable. The prostate is borderline normal in size. No inguinal lymphadenopathy is seen. No acute osseous abnormalities are identified. IMPRESSION: 1. Minimal right-sided hydronephrosis, with an obstructing 7 x 4 mm stone in the distal right ureter, just above the right vesicoureteral junction. 2. 2.8 cm hepatic cyst noted adjacent to the gallbladder fossa. Additional tiny nonspecific hypodensities seen within the liver. Electronically Signed   By: Roanna RaiderJeffery  Chang M.D.   On: 03/17/2015 02:46    Medications  sodium chloride 0.9 % bolus 500 mL (0 mLs Intravenous Stopped 03/17/15 0331)  ketorolac (TORADOL) 30 MG/ML injection 30 mg (30 mg Intravenous Given 03/17/15 0207)   Resting comfortably post medication and urinating on own    Case d/w Dr. Laverle PatterBorden, send home with flomax call on Monday to be seen  Already taking percocet and flomax, finish PO cipro   Patient and wife verbalize understanding and agree to follow up  I personally performed the services described in this documentation, which was scribed in my presence. The recorded information has been reviewed and is accurate.     Cy BlamerApril Ipek Westra, MD 03/17/15 [id number]

## 2015-03-17 NOTE — ED Notes (Signed)
Bladder Scan- 25cc

## 2015-03-17 NOTE — ED Notes (Signed)
Pt states is not been able to make urine for the past 6 hours and he has pain going for his right lower back to the front abd 7/10 now and having some nausea.

## 2015-03-17 NOTE — Discharge Instructions (Signed)
Kidney Stones °Kidney stones (urolithiasis) are deposits that form inside your kidneys. The intense pain is caused by the stone moving through the urinary tract. When the stone moves, the ureter goes into spasm around the stone. The stone is usually passed in the urine.  °CAUSES  °· A disorder that makes certain neck glands produce too much parathyroid hormone (primary hyperparathyroidism). °· A buildup of uric acid crystals, similar to gout in your joints. °· Narrowing (stricture) of the ureter. °· A kidney obstruction present at birth (congenital obstruction). °· Previous surgery on the kidney or ureters. °· Numerous kidney infections. °SYMPTOMS  °· Feeling sick to your stomach (nauseous). °· Throwing up (vomiting). °· Blood in the urine (hematuria). °· Pain that usually spreads (radiates) to the groin. °· Frequency or urgency of urination. °DIAGNOSIS  °· Taking a history and physical exam. °· Blood or urine tests. °· CT scan. °· Occasionally, an examination of the inside of the urinary bladder (cystoscopy) is performed. °TREATMENT  °· Observation. °· Increasing your fluid intake. °· Extracorporeal shock wave lithotripsy--This is a noninvasive procedure that uses shock waves to break up kidney stones. °· Surgery may be needed if you have severe pain or persistent obstruction. There are various surgical procedures. Most of the procedures are performed with the use of small instruments. Only small incisions are needed to accommodate these instruments, so recovery time is minimized. °The size, location, and chemical composition are all important variables that will determine the proper choice of action for you. Talk to your health care provider to better understand your situation so that you will minimize the risk of injury to yourself and your kidney.  °HOME CARE INSTRUCTIONS  °· Drink enough water and fluids to keep your urine clear or pale yellow. This will help you to pass the stone or stone fragments. °· Strain  all urine through the provided strainer. Keep all particulate matter and stones for your health care provider to see. The stone causing the pain may be as small as a grain of salt. It is very important to use the strainer each and every time you pass your urine. The collection of your stone will allow your health care provider to analyze it and verify that a stone has actually passed. The stone analysis will often identify what you can do to reduce the incidence of recurrences. °· Only take over-the-counter or prescription medicines for pain, discomfort, or fever as directed by your health care provider. °· Keep all follow-up visits as told by your health care provider. This is important. °· Get follow-up X-rays if required. The absence of pain does not always mean that the stone has passed. It may have only stopped moving. If the urine remains completely obstructed, it can cause loss of kidney function or even complete destruction of the kidney. It is your responsibility to make sure X-rays and follow-ups are completed. Ultrasounds of the kidney can show blockages and the status of the kidney. Ultrasounds are not associated with any radiation and can be performed easily in a matter of minutes. °· Make changes to your daily diet as told by your health care provider. You may be told to: °¨ Limit the amount of salt that you eat. °¨ Eat 5 or more servings of fruits and vegetables each day. °¨ Limit the amount of meat, poultry, fish, and eggs that you eat. °· Collect a 24-hour urine sample as told by your health care provider. You may need to collect another urine sample every 6-12   months. °SEEK MEDICAL CARE IF: °· You experience pain that is progressive and unresponsive to any pain medicine you have been prescribed. °SEEK IMMEDIATE MEDICAL CARE IF:  °· Pain cannot be controlled with the prescribed medicine. °· You have a fever or shaking chills. °· The severity or intensity of pain increases over 18 hours and is not  relieved by pain medicine. °· You develop a new onset of abdominal pain. °· You feel faint or pass out. °· You are unable to urinate. °  °This information is not intended to replace advice given to you by your health care provider. Make sure you discuss any questions you have with your health care provider. °  °Document Released: 04/21/2005 Document Revised: 01/10/2015 Document Reviewed: 09/22/2012 °Elsevier Interactive Patient Education ©2016 [employer] ° °

## 2015-10-08 ENCOUNTER — Encounter: Payer: Self-pay | Admitting: Gastroenterology

## 2015-12-04 ENCOUNTER — Ambulatory Visit (INDEPENDENT_AMBULATORY_CARE_PROVIDER_SITE_OTHER): Payer: BLUE CROSS/BLUE SHIELD | Admitting: Gastroenterology

## 2015-12-04 ENCOUNTER — Encounter: Payer: Self-pay | Admitting: Gastroenterology

## 2015-12-04 ENCOUNTER — Encounter (INDEPENDENT_AMBULATORY_CARE_PROVIDER_SITE_OTHER): Payer: Self-pay

## 2015-12-04 VITALS — BP 122/70 | HR 84 | Ht 67.0 in | Wt 178.0 lb

## 2015-12-04 DIAGNOSIS — Z800 Family history of malignant neoplasm of digestive organs: Secondary | ICD-10-CM | POA: Diagnosis not present

## 2015-12-04 DIAGNOSIS — R140 Abdominal distension (gaseous): Secondary | ICD-10-CM | POA: Diagnosis not present

## 2015-12-04 DIAGNOSIS — R1084 Generalized abdominal pain: Secondary | ICD-10-CM

## 2015-12-04 DIAGNOSIS — G8929 Other chronic pain: Secondary | ICD-10-CM

## 2015-12-04 MED ORDER — NA SULFATE-K SULFATE-MG SULF 17.5-3.13-1.6 GM/177ML PO SOLN: 1.0000 | Freq: Once | ORAL | 0 refills | Status: AC

## 2015-12-04 NOTE — Patient Instructions (Addendum)
You have been scheduled for a colonoscopy. Please follow written instructions given to you at your visit today.  Please pick up your prep supplies at the pharmacy within the next 1-3 days. If you use inhalers (even only as needed), please bring them with you on the day of your procedure. Your physician has requested that you go to www.startemmi.com and enter the access code given to you at your visit today. This web site gives a general overview about your procedure. However, you should still follow specific instructions given to you by our office regarding your preparation for the procedure.  Use VSL # 3 112 BU daily, this can be purchased over the counter

## 2015-12-04 NOTE — Progress Notes (Signed)
Todd Clayton    [id number]    06/14/1970  Primary Care Physician:No PCP Per Patient  Referring Physician: No referring provider defined for this encounter.  Chief complaint:  Generalized abdominal pain  HPI: 45 yr M here with c/o generalized abdominal pain intermittently for past few months. Denies any weight loss, constipation, diarrhea or blood per rectum. He has family history of colon cancer in his mother in her 33s and his brother in his 85s. He also has history of colon cancer in his aunt and uncle. Generalized abdominal pain is associated with bloating and sometimes worse right before he has a bowel movement, no relationship to diet. He has never had a colonoscopy.   Outpatient Encounter Prescriptions as of 12/04/2015  Medication Sig  . [EXPIRED] Na Sulfate-K Sulfate-Mg Sulf (SUPREP BOWEL PREP KIT) 17.5-3.13-1.6 GM/180ML SOLN Take 1 kit by mouth once.   No facility-administered encounter medications on file as of 12/04/2015.     Allergies as of 12/04/2015  . (No Known Allergies)    Past Medical History:  Diagnosis Date  . Hyperlipidemia   . Hypertension   . Kidney stones     Past Surgical History:  Procedure Laterality Date  . LITHOTRIPSY      History reviewed. No pertinent family history.  Social History   Social History  . Marital status: Single    Spouse name: N/A  . Number of children: N/A  . Years of education: N/A   Occupational History  . Not on file.   Social History Main Topics  . Smoking status: Current Some Day Smoker    Types: Cigarettes  . Smokeless tobacco: Never Used  . Alcohol use Yes     Comment: occasionally on weekends   . Drug use: Unknown  . Sexual activity: Not on file   Other Topics Concern  . Not on file   Social History Narrative  . No narrative on file      Review of systems: Review of Systems  Constitutional: Negative for fever and chills.  HENT: Negative.   Eyes: Negative for blurred vision.    Respiratory: Negative for cough, shortness of breath and wheezing.   Cardiovascular: Negative for chest pain and palpitations.  Gastrointestinal: as per HPI Genitourinary: Negative for dysuria, urgency, frequency and hematuria.  Musculoskeletal: Negative for myalgias, back pain and joint pain.  Skin: Negative for itching and rash.  Neurological: Negative for dizziness, tremors, focal weakness, seizures and loss of consciousness.  Endo/Heme/Allergies: Negative for environmental allergies.  Psychiatric/Behavioral: Negative for depression, suicidal ideas and hallucinations.  All other systems reviewed and are negative.   Physical Exam: Vitals:   12/04/15 1512  BP: 122/70  Pulse: 84   Gen:      No acute distress HEENT:  EOMI, sclera anicteric Neck:     No masses; no thyromegaly Lungs:    Clear to auscultation bilaterally; normal respiratory effort CV:         Regular rate and rhythm; no murmurs Abd:      + bowel sounds; soft, non-tender; no palpable masses, no distension Ext:    No edema; adequate peripheral perfusion Skin:      Warm and dry; no rash Neuro: alert and oriented x 3 Psych: normal mood and affect  Data Reviewed:  Reviewed chart in epic   Assessment and Plan/Recommendations:  45 year old male with no significant past medical history here with complaints of generalized abdominal pain associated with bloating. He  also has significant family history of colon cancer We'll proceed with colonoscopy for colorectal cancer screening given significant family history Start probiotic VSL # 3 112 BU 1 capsule daily  Return as needed after colonoscopy  Greater than 50% of the time used for counseling as well as treatment plan and follow-up. He had multiple questions which were answered to his satisfaction   K. Denzil Magnuson , MD [phone] Mon-Fri 8a-5p [phone] after 5p, weekends, holidays  CC: No ref. provider found

## 2015-12-05 ENCOUNTER — Encounter: Payer: Self-pay | Admitting: Gastroenterology

## 2015-12-18 ENCOUNTER — Telehealth: Payer: Self-pay | Admitting: Gastroenterology

## 2015-12-18 NOTE — Telephone Encounter (Signed)
Patient calling in regarding this.  °

## 2015-12-18 NOTE — Telephone Encounter (Signed)
Called patient to come pick up his sample Suprep kit today   He will be here before 5

## 2015-12-19 ENCOUNTER — Ambulatory Visit (AMBULATORY_SURGERY_CENTER): Payer: BLUE CROSS/BLUE SHIELD | Admitting: Gastroenterology

## 2015-12-19 ENCOUNTER — Encounter: Payer: Self-pay | Admitting: Gastroenterology

## 2015-12-19 VITALS — BP 123/88 | HR 82 | Temp 98.2°F | Resp 18 | Ht 67.0 in | Wt 178.0 lb

## 2015-12-19 DIAGNOSIS — Z1211 Encounter for screening for malignant neoplasm of colon: Secondary | ICD-10-CM

## 2015-12-19 DIAGNOSIS — Z800 Family history of malignant neoplasm of digestive organs: Secondary | ICD-10-CM

## 2015-12-19 DIAGNOSIS — D124 Benign neoplasm of descending colon: Secondary | ICD-10-CM

## 2015-12-19 HISTORY — PX: COLONOSCOPY: SHX174

## 2015-12-19 MED ORDER — SODIUM CHLORIDE 0.9 % IV SOLN: 500.0000 mL | INTRAVENOUS | Status: DC

## 2015-12-19 NOTE — Progress Notes (Signed)
Pt and his wife reported he passed a good amount of flatus in the restroom.  No complaints noted on discharge.  maw

## 2015-12-19 NOTE — Patient Instructions (Signed)
YOU HAD AN ENDOSCOPIC PROCEDURE TODAY AT [facility]:   Refer to the procedure report that was given to you for any specific questions about what was found during the examination.  If the procedure report does not answer your questions, please call your gastroenterologist to clarify.  If you requested that your care partner not be given the details of your procedure findings, then the procedure report has been included in a sealed envelope for you to review at your convenience later.  YOU SHOULD EXPECT: Some feelings of bloating in the abdomen. Passage of more gas than usual.  Walking can help get rid of the air that was put into your GI tract during the procedure and reduce the bloating. If you had a lower endoscopy (such as a colonoscopy or flexible sigmoidoscopy) you may notice spotting of blood in your stool or on the toilet paper. If you underwent a bowel prep for your procedure, you may not have a normal bowel movement for a few days.  Please Note:  You might notice some irritation and congestion in your nose or some drainage.  This is from the oxygen used during your procedure.  There is no need for concern and it should clear up in a day or so.  SYMPTOMS TO REPORT IMMEDIATELY:   Following lower endoscopy (colonoscopy or flexible sigmoidoscopy):  Excessive amounts of blood in the stool  Significant tenderness or worsening of abdominal pains  Swelling of the abdomen that is new, acute  Fever of 100F or higher   Following upper endoscopy (EGD)  Vomiting of blood or coffee ground material  New chest pain or pain under the shoulder blades  Painful or persistently difficult swallowing  New shortness of breath  Fever of 100F or higher  Black, tarry-looking stools  For urgent or emergent issues, a gastroenterologist can be reached at any hour by calling [phone].   DIET:  We do recommend a small meal at first, but then you may proceed to your regular diet.  Drink  plenty of fluids but you should avoid alcoholic beverages for 24 hours.  ACTIVITY:  You should plan to take it easy for the rest of today and you should NOT DRIVE or use heavy machinery until tomorrow (because of the sedation medicines used during the test).    FOLLOW UP: Our staff will call the number listed on your records the next business day following your procedure to check on you and address any questions or concerns that you may have regarding the information given to you following your procedure. If we do not reach you, we will leave a message.  However, if you are feeling well and you are not experiencing any problems, there is no need to return our call.  We will assume that you have returned to your regular daily activities without incident.  If any biopsies were taken you will be contacted by phone or by letter within the next 1-3 weeks.  Please call us at [phone] if you have not heard about the biopsies in 3 weeks.    SIGNATURES/CONFIDENTIALITY: You and/or your care partner have signed paperwork which will be entered into your electronic medical record.  These signatures attest to the fact that that the information above on your After Visit Summary has been reviewed and is understood.  Full responsibility of the confidentiality of this discharge information lies with you and/or your care-partner.    Handouts were given to your care partner on polyps,  hemorrhoids, and a high fiber diet with liberal fluid intake. You may resume your current medications today. Await biopsy results. Please call if any questions or concerns.   

## 2015-12-19 NOTE — Op Note (Signed)
[name] Procedure Date: 12/19/2015 8:43 AM MRN: [id number] Endoscopist: Mauri Pole , MD Age: 45 Referring MD:  Date of Birth: 06/14/1970 Gender: Male Account #: [id number] Procedure:                Colonoscopy Indications:              Colon cancer screening in patient at increased                            risk: Family history of colorectal cancer in                            multiple 2nd degree relatives, This is the                            patient's first colonoscopy Medicines:                Monitored Anesthesia Care Procedure:                Pre-Anesthesia Assessment:                           - Prior to the procedure, a History and Physical                            was performed, and patient medications and                            allergies were reviewed. The patient's tolerance of                            previous anesthesia was also reviewed. The risks                            and benefits of the procedure and the sedation                            options and risks were discussed with the patient.                            All questions were answered, and informed consent                            was obtained. Prior Anticoagulants: The patient has                            taken no previous anticoagulant or antiplatelet                            agents. ASA Grade Assessment: II - A patient with                            mild systemic disease. After reviewing the risks  and benefits, the patient was deemed in                            satisfactory condition to undergo the procedure.                           After obtaining informed consent, the colonoscope                            was passed under direct vision. Throughout the                            procedure, the patient's blood pressure, pulse, and                            oxygen saturations were monitored continuously.  The                            Model CF-HQ190L [id number]) scope was introduced                            through the anus and advanced to the the terminal                            ileum, with identification of the appendiceal                            orifice and IC valve. The colonoscopy was somewhat                            difficult due to inadequate bowel prep. Successful                            completion of the procedure was aided by lavage.                            The patient tolerated the procedure well. The                            quality of the bowel preparation was adequate. The                            terminal ileum, ileocecal valve, appendiceal                            orifice, and rectum were photographed. Scope In: 9:01:16 AM Scope Out: 9:18:00 AM Scope Withdrawal Time: 0 hours 12 minutes 12 seconds  Total Procedure Duration: 0 hours 16 minutes 44 seconds  Findings:                 The perianal and digital rectal examinations were                            normal.  A 6 mm polyp was found in the descending colon. The                            polyp was sessile. The polyp was removed with a                            cold snare. Resection and retrieval were complete.                           Scattered small-mouthed diverticula were found in                            the sigmoid colon, descending colon, transverse                            colon and ascending colon.                           Non-bleeding internal hemorrhoids were found during                            retroflexion. The hemorrhoids were medium-sized.                           The exam was otherwise without abnormality. Complications:            No immediate complications. Estimated Blood Loss:     Estimated blood loss was minimal. Impression:               - One 6 mm polyp in the descending colon, removed                            with a cold snare.  Resected and retrieved.                           - Diverticulosis in the sigmoid colon, in the                            descending colon, in the transverse colon and in                            the ascending colon.                           - Non-bleeding internal hemorrhoids.                           - The examination was otherwise normal. Recommendation:           - Patient has a contact number available for                            emergencies. The signs and symptoms of potential  delayed complications were discussed with the                            patient. Return to normal activities tomorrow.                            Written discharge instructions were provided to the                            patient.                           - Resume previous diet.                           - Continue present medications.                           - Await pathology results.                           - Repeat colonoscopy in 5 years for surveillance                            based on pathology results.                           - Return to GI clinic PRN. Mauri Pole, MD 12/19/2015 9:23:46 AM This report has been signed electronically.

## 2015-12-19 NOTE — Progress Notes (Signed)
Pt is very concervative and did not feel comfortable passing flatus.  He did pass some, but I told him he could go to the restroom after 20 mins and he could be taken off the monitor.  Pt was assisted to the restroom.  No complaints noted in the recovery room. maw

## 2015-12-19 NOTE — Progress Notes (Signed)
No problems noted in the recovery room. maw 

## 2015-12-19 NOTE — Progress Notes (Signed)
Transported to recovery , VSS Report to RN

## 2015-12-20 ENCOUNTER — Telehealth: Payer: Self-pay | Admitting: *Deleted

## 2015-12-20 NOTE — Telephone Encounter (Signed)
  Follow up Call-  Call back number 12/19/2015  Post procedure Call Back phone  # [phone]  Permission to leave phone message Yes  Some recent data might be hidden     Patient questions:  Do you have a fever, pain , or abdominal swelling? No. Pain Score  0 *  Have you tolerated food without any problems? Yes.    Have you been able to return to your normal activities? Yes.    Do you have any questions about your discharge instructions: Diet   No. Medications  No. Follow up visit  No.  Do you have questions or concerns about your Care? No.  Actions: * If pain score is 4 or above: No action needed, pain <4.

## 2015-12-25 ENCOUNTER — Encounter: Payer: Self-pay | Admitting: Gastroenterology

## 2016-11-11 ENCOUNTER — Telehealth: Payer: Self-pay | Admitting: Gastroenterology

## 2016-11-12 NOTE — Telephone Encounter (Signed)
Spoke with the wife. Reviewed the results from last year's colonoscopy and pathology.  Patient has had GI symptoms for "about 3 months." Complains of bloating and abdominal pain. He has taken probiotics without improvement. Appointment made for evaluation.

## 2016-11-20 ENCOUNTER — Ambulatory Visit: Payer: BLUE CROSS/BLUE SHIELD | Admitting: Gastroenterology

## 2017-11-08 IMAGING — CT CT RENAL STONE PROTOCOL
2 of 4 series · 10 of 46 positions shown, 11 images · non-contrast
Comparison: CT of the abdomen and pelvis performed 05/22/2009

CLINICAL DATA: Inability to urinate for 6 hours, with right lower
back pain, radiating to the abdomen. Initial encounter.

EXAM:
CT ABDOMEN AND PELVIS WITHOUT CONTRAST
TECHNIQUE: Multidetector CT imaging of the abdomen and pelvis was performed
following the standard protocol without IV contrast.

[Series 201: stone study, idose (2) · axial · 0.80mm/px · z∈[-497,-67]mm · 7 of 104 slices shown, 8 images]
[im 9/104  soft-tissue]
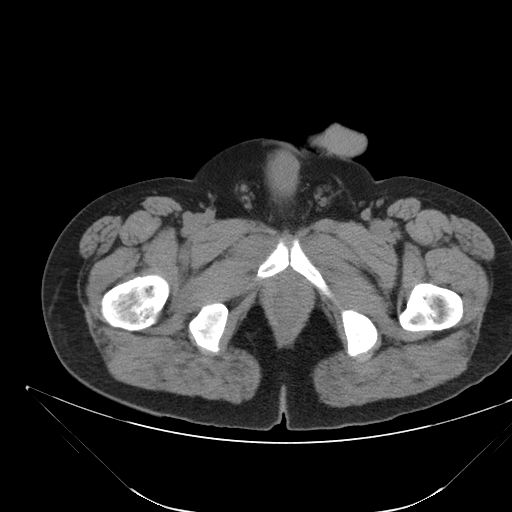
[im 9/104  bone]
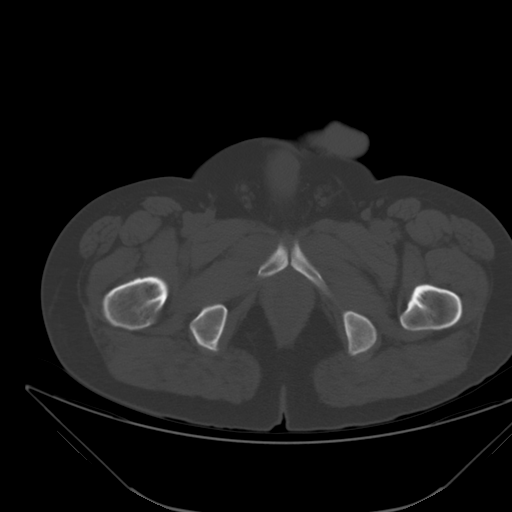
[im 23/104  soft-tissue]
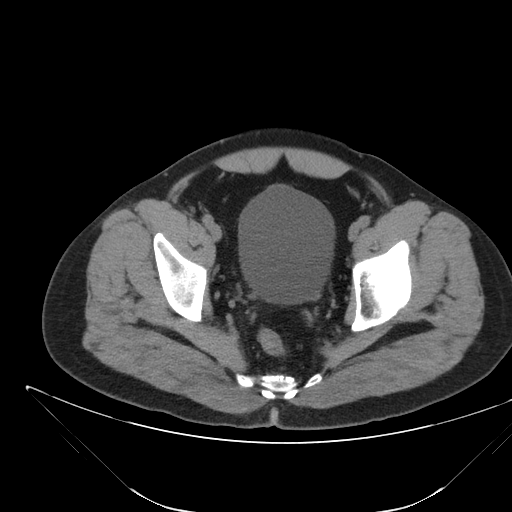
[im 36/104  soft-tissue]
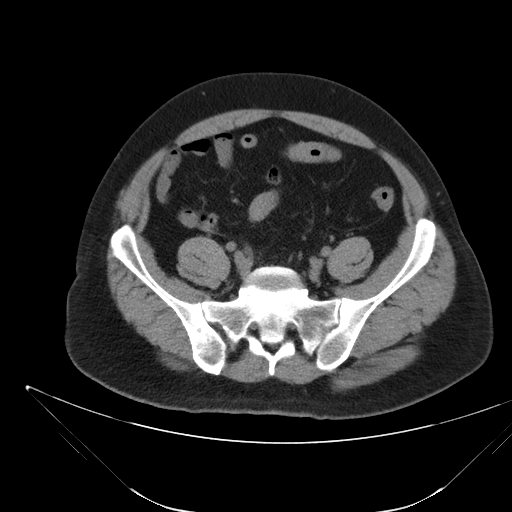
[im 54/104  soft-tissue]
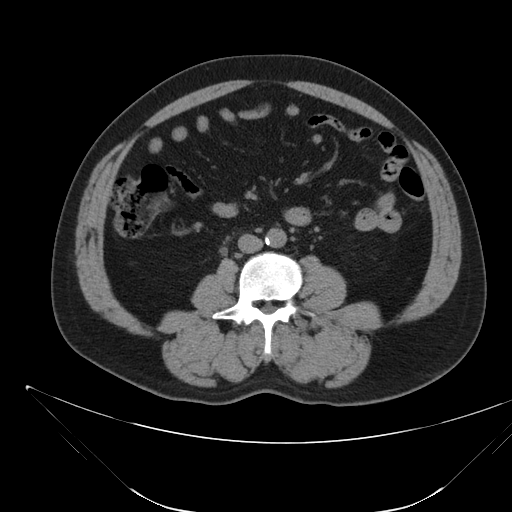
[im 68/104  soft-tissue]
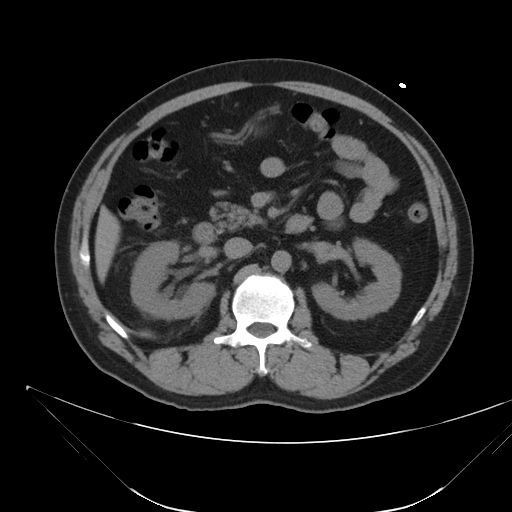
[im 81/104  soft-tissue]
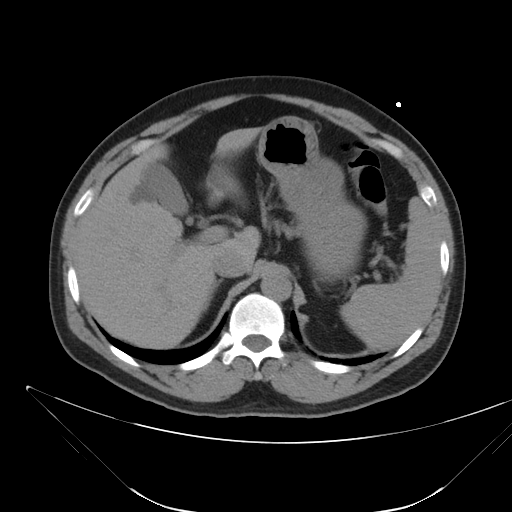
[im 95/104  soft-tissue]
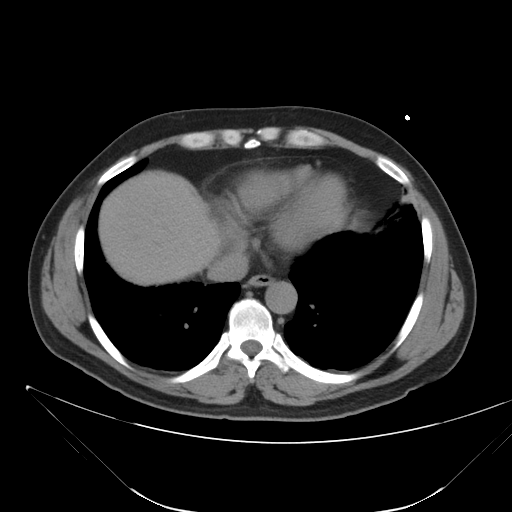

[Series 203: coronals, idose (2) · coronal · 0.45mm/px · 3 of 123 slices shown]
[im 41/123  soft-tissue]
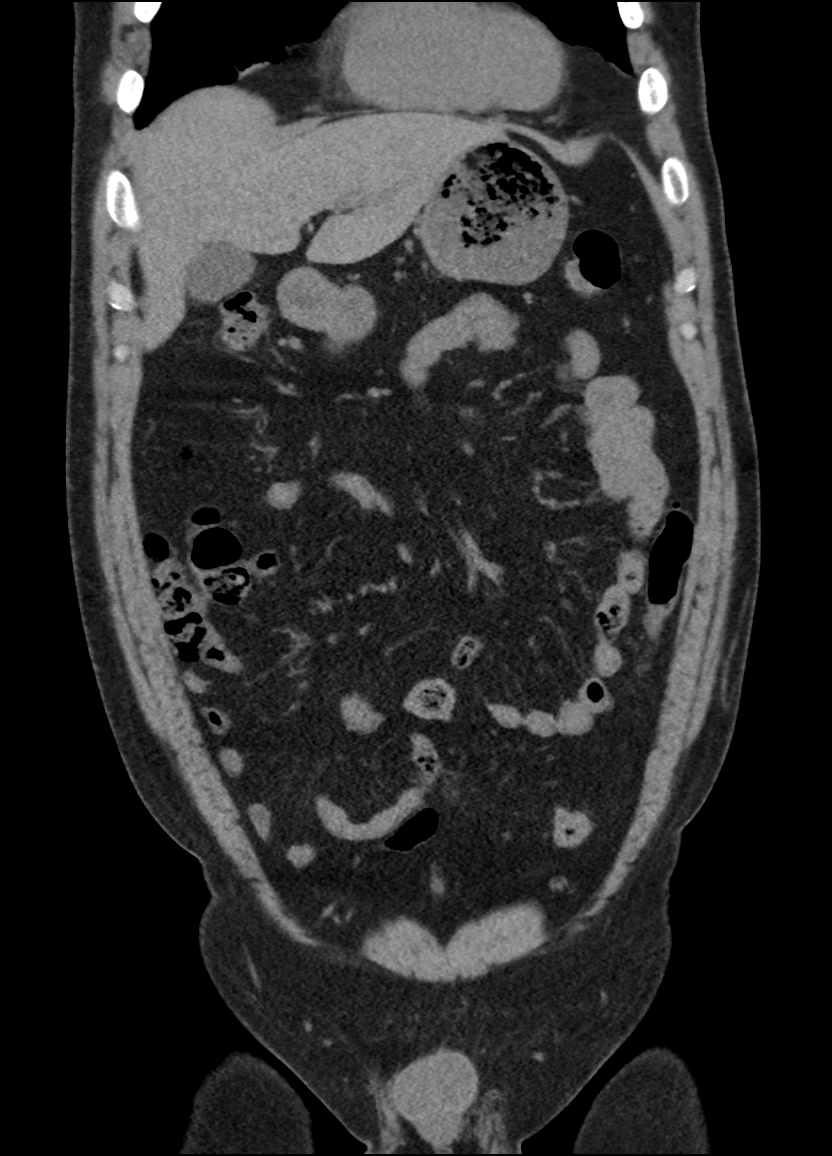
[im 55/123  soft-tissue]
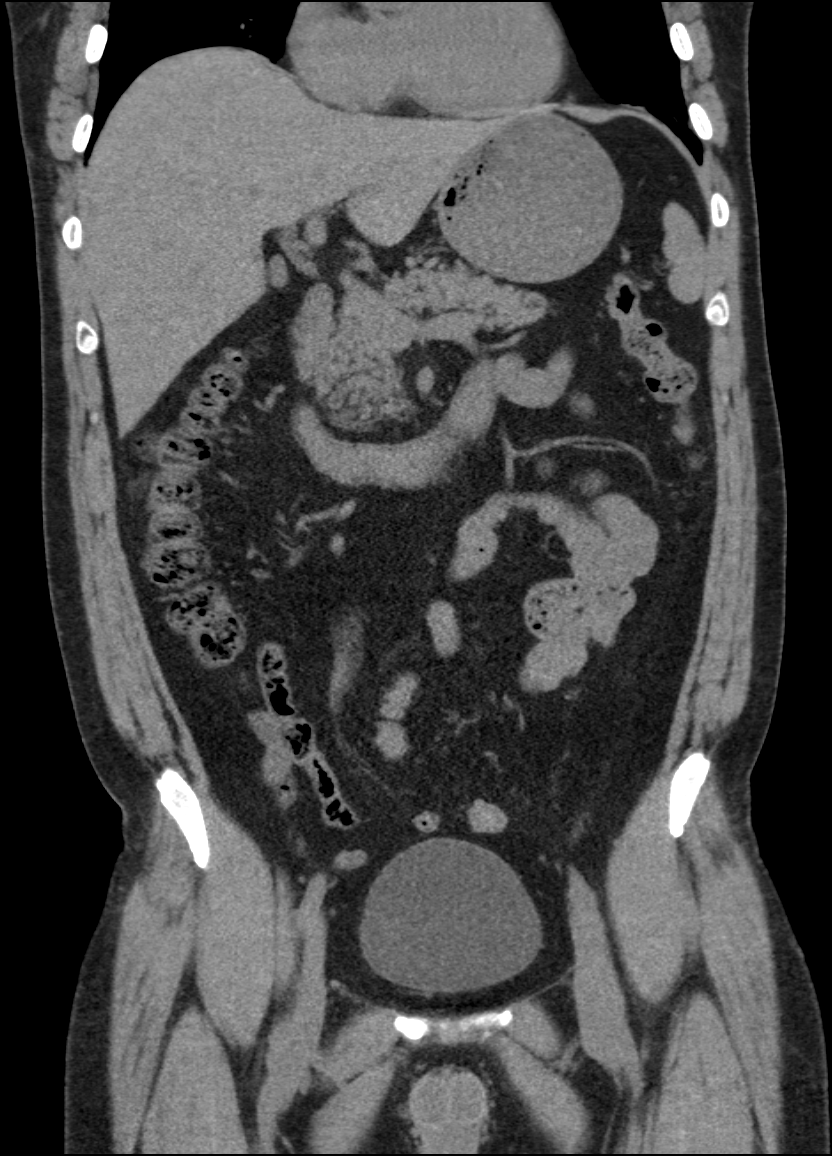
[im 68/123  soft-tissue]
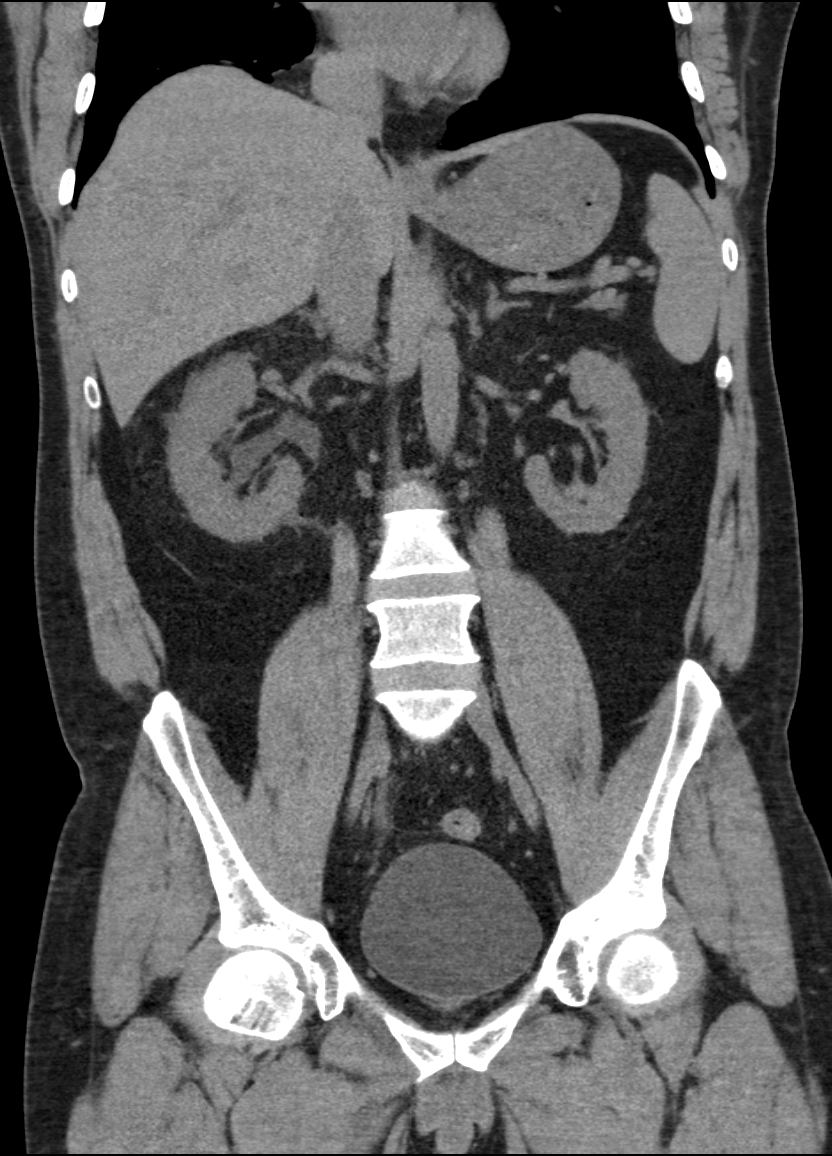

[10 of 46 positions shown; findings below may reference images not displayed]

FINDINGS: Minimal bibasilar atelectasis is noted.

A 2.8 cm hepatic cyst is noted adjacent to the gallbladder fossa. A
few tiny nonspecific hypodensities are seen within the liver. The
liver and spleen are otherwise unremarkable. The gallbladder is
within normal limits. The pancreas and adrenal glands are
unremarkable.

There is minimal right-sided hydronephrosis, with right-sided
perinephric stranding and fluid, and diffuse prominence of the right
ureter to the level of an obstructing 7 x 4 mm stone in the distal
right ureter, just above the right vesicoureteral junction. Minimal
nonspecific left-sided perinephric stranding is seen. No
nonobstructing renal stones are identified.

No free fluid is identified. The small bowel is unremarkable in
appearance. The stomach is within normal limits. No acute vascular
abnormalities are seen. Minimal calcification is noted along the
distal abdominal aorta.

The appendix is normal in caliber, without evidence of appendicitis.
The colon is unremarkable in appearance.

The bladder is mildly distended and grossly unremarkable. The
prostate is borderline normal in size. No inguinal lymphadenopathy
is seen.

No acute osseous abnormalities are identified.
IMPRESSION: 1. Minimal right-sided hydronephrosis, with an obstructing 7 x 4 mm
stone in the distal right ureter, just above the right
vesicoureteral junction.
2. 2.8 cm hepatic cyst noted adjacent to the gallbladder fossa.
Additional tiny nonspecific hypodensities seen within the liver.

## 2021-01-09 ENCOUNTER — Encounter: Payer: Self-pay | Admitting: Gastroenterology

## 2021-02-20 ENCOUNTER — Other Ambulatory Visit: Payer: Self-pay

## 2021-02-20 ENCOUNTER — Ambulatory Visit (AMBULATORY_SURGERY_CENTER): Payer: 59 | Admitting: *Deleted

## 2021-02-20 VITALS — Ht 67.0 in | Wt 180.0 lb

## 2021-02-20 DIAGNOSIS — Z800 Family history of malignant neoplasm of digestive organs: Secondary | ICD-10-CM

## 2021-02-20 DIAGNOSIS — Z86010 Personal history of colonic polyps: Secondary | ICD-10-CM

## 2021-02-20 MED ORDER — PEG 3350-KCL-NA BICARB-NACL 420 G PO SOLR
4000.0000 mL | Freq: Once | ORAL | 0 refills | Status: DC
Start: 2021-02-20 — End: 2021-02-20

## 2021-02-20 MED ORDER — PEG 3350-KCL-NA BICARB-NACL 420 G PO SOLR: 4000.0000 mL | Freq: Once | ORAL | 0 refills | Status: AC

## 2021-02-20 NOTE — Progress Notes (Signed)
Patient's pre-visit was done today over the phone with the patient due to COVID-19 pandemic. Name,DOB and address verified. Patient denies any allergies to Eggs and Soy. Patient denies any problems with anesthesia/sedation. Patient is not taking any diet pills or blood thinners. No home Oxygen. Packet of Prep instructions mailed to patient including a copy of a consent form-pt is aware. Patient understands to call us back with any questions or concerns. Patient is aware of our care-partner policy and [id number] safety protocol.   The patient is COVID-19 vaccinated.

## 2021-03-05 ENCOUNTER — Telehealth: Payer: Self-pay | Admitting: Gastroenterology

## 2021-03-05 NOTE — Telephone Encounter (Signed)
Inbound call from pt requesting a call back stating that he did not do his split prep from yesterday. Procedure is scheduled for tomorrow. Please advise. Thank you.

## 2021-03-05 NOTE — Telephone Encounter (Signed)
Can proceed with colonoscopy if he can start drinking the prep now and if he has not eaten any solid meals today.  Please advise him to complete the full prep today and do a split dose tomorrow as per his instructions so he does some extended bowel prep.  Thank you

## 2021-03-05 NOTE — Telephone Encounter (Signed)
Pt's wife, H'Thoan Ksor returned call from vm left by Alphonsa Gin, RN earlier (ok to speak with wife per [facility]).  Pt's wife stated that pt is scheduled for colonoscopy procedure tomorrow but pt did not start prep yesterday and has not done any prep today.  Pt's wife also stated that pt ate nuts on both Friday and Saturday.  Pt's wife wants to know if procedure should be cancelled.  Please advise.  Please call pt's wife back with decision.  Call back # for wife is [phone].  Ok to leave a vm.

## 2021-03-05 NOTE — Telephone Encounter (Signed)
LM on VM for patient to call back.  ?

## 2021-03-06 ENCOUNTER — Encounter: Payer: BLUE CROSS/BLUE SHIELD | Admitting: Gastroenterology

## 2021-03-06 NOTE — Telephone Encounter (Signed)
Got it.

## 2021-03-06 NOTE — Telephone Encounter (Signed)
Called and spoke with pt's wife (ok per DPR). Pt ate solid foods yesterday and did not drink any prep.  Pt's procedure cancelled for today and rescheduled for 03/20/21 @ 11:00 am.  Pt's wife given new prep times.  Pt's wife wrote new prep and arrival times down and voiced understanding.

## 2021-03-20 ENCOUNTER — Ambulatory Visit (AMBULATORY_SURGERY_CENTER): Payer: 59 | Admitting: Gastroenterology

## 2021-03-20 ENCOUNTER — Encounter: Payer: Self-pay | Admitting: Gastroenterology

## 2021-03-20 ENCOUNTER — Other Ambulatory Visit: Payer: Self-pay

## 2021-03-20 VITALS — BP 137/92 | HR 82 | Temp 97.3°F | Resp 19 | Ht 67.0 in | Wt 180.0 lb

## 2021-03-20 DIAGNOSIS — Z86010 Personal history of colonic polyps: Secondary | ICD-10-CM

## 2021-03-20 DIAGNOSIS — Z800 Family history of malignant neoplasm of digestive organs: Secondary | ICD-10-CM

## 2021-03-20 DIAGNOSIS — D122 Benign neoplasm of ascending colon: Secondary | ICD-10-CM

## 2021-03-20 MED ORDER — SODIUM CHLORIDE 0.9 % IV SOLN: 500.0000 mL | Freq: Once | INTRAVENOUS | Status: DC

## 2021-03-20 NOTE — Op Note (Signed)
[place] Patient Name: Todd Clayton Procedure Date: 03/20/2021 11:01 AM MRN: [id number] Endoscopist: Mauri Pole , MD Age: 50 Referring MD:  Date of Birth: June 10, 1970 Gender: Male Account #: [id number] Procedure:                Colonoscopy Indications:              Screening in patient at increased risk: Family                            history of 1st-degree relative with colorectal                            cancer, High risk colon cancer surveillance:                            Personal history of colonic polyps Medicines:                Monitored Anesthesia Care Procedure:                Pre-Anesthesia Assessment:                           - Prior to the procedure, a History and Physical                            was performed, and patient medications and                            allergies were reviewed. The patient's tolerance of                            previous anesthesia was also reviewed. The risks                            and benefits of the procedure and the sedation                            options and risks were discussed with the patient.                            All questions were answered, and informed consent                            was obtained. Prior Anticoagulants: The patient has                            taken no previous anticoagulant or antiplatelet                            agents. ASA Grade Assessment: II - A patient with                            mild systemic disease. After reviewing the risks  and benefits, the patient was deemed in                            satisfactory condition to undergo the procedure.                           After obtaining informed consent, the colonoscope                            was passed under direct vision. Throughout the                            procedure, the patient's blood pressure, pulse, and                            oxygen saturations were monitored  continuously. The                            Olympus PCF-H190DL [id number]) Colonoscope was                            introduced through the anus and advanced to the the                            cecum, identified by appendiceal orifice and                            ileocecal valve. The colonoscopy was performed                            without difficulty. The patient tolerated the                            procedure well. The quality of the bowel                            preparation was adequate to identify polyps 6 mm                            and larger in size. The ileocecal valve,                            appendiceal orifice, and rectum were photographed. Scope In: 11:10:28 AM Scope Out: 11:27:45 AM Scope Withdrawal Time: 0 hours 12 minutes 50 seconds  Total Procedure Duration: 0 hours 17 minutes 17 seconds  Findings:                 The perianal and digital rectal examinations were                            normal.                           A 12 mm polyp was found in the ascending colon. The  polyp was pedunculated. The polyp was removed with                            a hot snare. Resection and retrieval were complete.                           Scattered small-mouthed diverticula were found in                            the sigmoid colon, descending colon, transverse                            colon and ascending colon.                           Non-bleeding external and internal hemorrhoids were                            found during retroflexion. The hemorrhoids were                            small. Complications:            No immediate complications. Estimated Blood Loss:     Estimated blood loss was minimal. Impression:               - One 12 mm polyp in the ascending colon, removed                            with a hot snare. Resected and retrieved.                           - Diverticulosis in the sigmoid colon, in the                             descending colon, in the transverse colon and in                            the ascending colon.                           - Non-bleeding external and internal hemorrhoids. Recommendation:           - Patient has a contact number available for                            emergencies. The signs and symptoms of potential                            delayed complications were discussed with the                            patient. Return to normal activities tomorrow.                            Written discharge instructions were  provided to the                            patient.                           - Resume previous diet.                           - Continue present medications.                           - Await pathology results.                           - Repeat colonoscopy in 3 years for surveillance                            based on pathology results. Mauri Pole, MD 03/20/2021 11:33:31 AM This report has been signed electronically.

## 2021-03-20 NOTE — Progress Notes (Signed)
Report given to PACU, vss 

## 2021-03-20 NOTE — Progress Notes (Signed)
Called to room to assist during endoscopic procedure.  Patient ID and intended procedure confirmed with present staff. Received instructions for my participation in the procedure from the performing physician.  

## 2021-03-20 NOTE — Progress Notes (Signed)
[facility] History and Physical   Primary Care Physician:  Patient, No Pcp Per (Inactive)   Reason for Procedure:  Family history of colon cancer and personal h/o colon polyps  Plan:    Surveillance colonoscopy with possible interventions as needed     HPI: Todd Clayton is a very pleasant 50 y.o. malehere for surveillance colonoscopy. Denies any nausea, vomiting, abdominal pain, melena or bright red blood per rectum  The risks and benefits as well as alternatives of endoscopic procedure(s) have been discussed and reviewed. All questions answered. The patient agrees to proceed.    Past Medical History:  Diagnosis Date   Allergy    Hyperlipidemia    Hypertension    Kidney stones     Past Surgical History:  Procedure Laterality Date   COLONOSCOPY  12/19/2015   Dr.Anett Ranker   LITHOTRIPSY     POLYPECTOMY      Prior to Admission medications   Medication Sig Start Date End Date Taking? Authorizing Provider  Multiple Vitamin (MULTIVITAMIN) tablet Take 1 tablet by mouth daily.   Yes [provider]    Current Outpatient Medications  Medication Sig Dispense Refill   Multiple Vitamin (MULTIVITAMIN) tablet Take 1 tablet by mouth daily.     Current Facility-Administered Medications  Medication Dose Route Frequency Provider Last Rate Last Admin   0.9 %  sodium chloride infusion  500 mL Intravenous Once Mauri Pole, MD        Allergies as of 03/20/2021   (No Known Allergies)    Family History  Problem Relation Age of Onset   Colon cancer Mother        36's   Stomach cancer Sister    Liver cancer Brother    Esophageal cancer Neg Hx    Rectal cancer Neg Hx     Social History   Socioeconomic History   Marital status: Single    Spouse name: Not on file   Number of children: Not on file   Years of education: Not on file   Highest education level: Not on file  Occupational History   Not on file  Tobacco Use   Smoking status:  Former    Types: Cigarettes   Smokeless tobacco: Never  Vaping Use   Vaping Use: Never used  Substance and Sexual Activity   Alcohol use: Yes    Alcohol/week: 1.0 standard drink    Types: 1 Cans of beer per week    Comment: occasionally on weekends    Drug use: Never   Sexual activity: Not on file  Other Topics Concern   Not on file  Social History Narrative   ** Merged History Encounter **       Social Determinants of Health   Financial Resource Strain: Not on file  Food Insecurity: Not on file  Transportation Needs: Not on file  Physical Activity: Not on file  Stress: Not on file  Social Connections: Not on file  Intimate Partner Violence: Not on file    Review of Systems:  All other review of systems negative except as mentioned in the HPI.  Physical Exam: Vital signs in last 24 hours: BP 133/85   Pulse 99   Temp (!) 97.3 F (36.3 C) (Temporal)   Resp 16   Ht 5\' 7"  (1.702 m)   Wt 180 lb (81.6 kg)   SpO2 94%   BMI 28.19 kg/m     General:   Alert, NAD Lungs:  Clear .  Heart:  Regular rate and rhythm Abdomen:  Soft, nontender and nondistended. Neuro/Psych:  Alert and cooperative. Normal mood and affect. A and O x 3  Reviewed labs, radiology imaging, old records and pertinent past GI work up  Patient is appropriate for planned procedure(s) and anesthesia in an ambulatory setting   K. Denzil Magnuson , MD [phone]

## 2021-03-20 NOTE — Patient Instructions (Signed)
Handouts given on polyps and hemorrhoids.  YOU HAD AN ENDOSCOPIC PROCEDURE TODAY AT [facility]:   Refer to the procedure report that was given to you for any specific questions about what was found during the examination.  If the procedure report does not answer your questions, please call your gastroenterologist to clarify.  If you requested that your care partner not be given the details of your procedure findings, then the procedure report has been included in a sealed envelope for you to review at your convenience later.  YOU SHOULD EXPECT: Some feelings of bloating in the abdomen. Passage of more gas than usual.  Walking can help get rid of the air that was put into your GI tract during the procedure and reduce the bloating. If you had a lower endoscopy (such as a colonoscopy or flexible sigmoidoscopy) you may notice spotting of blood in your stool or on the toilet paper. If you underwent a bowel prep for your procedure, you may not have a normal bowel movement for a few days.  Please Note:  You might notice some irritation and congestion in your nose or some drainage.  This is from the oxygen used during your procedure.  There is no need for concern and it should clear up in a day or so.  SYMPTOMS TO REPORT IMMEDIATELY:  Following lower endoscopy (colonoscopy or flexible sigmoidoscopy):  Excessive amounts of blood in the stool  Significant tenderness or worsening of abdominal pains  Swelling of the abdomen that is new, acute  Fever of 100F or higher   For urgent or emergent issues, a gastroenterologist can be reached at any hour by calling [phone]. Do not use MyChart messaging for urgent concerns.    DIET:  We do recommend a small meal at first, but then you may proceed to your regular diet.  Drink plenty of fluids but you should avoid alcoholic beverages for 24 hours.  ACTIVITY:  You should plan to take it easy for the rest of today and you should NOT DRIVE or  use heavy machinery until tomorrow (because of the sedation medicines used during the test).    FOLLOW UP: Our staff will call the number listed on your records 48-72 hours following your procedure to check on you and address any questions or concerns that you may have regarding the information given to you following your procedure. If we do not reach you, we will leave a message.  We will attempt to reach you two times.  During this call, we will ask if you have developed any symptoms of COVID 19. If you develop any symptoms (ie: fever, flu-like symptoms, shortness of breath, cough etc.) before then, please call [phone].  If you test positive for Covid 19 in the 2 weeks post procedure, please call and report this information to [place].    If any biopsies were taken you will be contacted by phone or by letter within the next 1-3 weeks.  Please call us at [phone] if you have not heard about the biopsies in 3 weeks.    SIGNATURES/CONFIDENTIALITY: You and/or your care partner have signed paperwork which will be entered into your electronic medical record.  These signatures attest to the fact that that the information above on your After Visit Summary has been reviewed and is understood.  Full responsibility of the confidentiality of this discharge information lies with you and/or your care-partner.

## 2021-03-20 NOTE — Progress Notes (Signed)
VS completed by DT.   Pt's states no medical or surgical changes since previsit or office visit.   Patient stated that they drank all of the prep last night instead of splitting it in two doses. Patient also stated they ate rice last night at 5 pm before starting the prep but said they were clear/yellow with no solid pieces for their bowel movements. MD made aware.

## 2021-03-22 ENCOUNTER — Telehealth: Payer: Self-pay

## 2021-03-22 NOTE — Telephone Encounter (Signed)
  Follow up Call-  Call back number 03/20/2021  Post procedure Call Back phone  # [phone]  Permission to leave phone message Yes  Some recent data might be hidden     Patient questions:  Do you have a fever, pain , or abdominal swelling? No. Pain Score  0 *  Have you tolerated food without any problems? Yes.    Have you been able to return to your normal activities? Yes.    Do you have any questions about your discharge instructions: Diet   No. Medications  No. Follow up visit  No.  Do you have questions or concerns about your Care? Yes.    Pt states he did have some bleeding upon moving his bowels the day of the procedure.  Also, he had a little bit yesterday but less than the day before.  Encouraged pt to call if he has concerns or the bleeding gets worse.  Pt does have hemorrhoids Actions: * If pain score is 4 or above: No action needed, pain <4.

## 2021-04-05 ENCOUNTER — Encounter: Payer: Self-pay | Admitting: Gastroenterology

## 2021-12-29 ENCOUNTER — Emergency Department (HOSPITAL_COMMUNITY): Payer: Commercial Managed Care - HMO

## 2021-12-29 ENCOUNTER — Other Ambulatory Visit: Payer: Self-pay

## 2021-12-29 ENCOUNTER — Emergency Department (HOSPITAL_COMMUNITY)
Admission: EM | Admit: 2021-12-29 | Discharge: 2021-12-29 | Disposition: A | Discharge status: Home / Self Care | Payer: Commercial Managed Care - HMO | Attending: Emergency Medicine | Admitting: Emergency Medicine

## 2021-12-29 DIAGNOSIS — Y92410 Unspecified street and highway as the place of occurrence of the external cause: Secondary | ICD-10-CM | POA: Insufficient documentation

## 2021-12-29 DIAGNOSIS — R1084 Generalized abdominal pain: Secondary | ICD-10-CM | POA: Insufficient documentation

## 2021-12-29 DIAGNOSIS — R911 Solitary pulmonary nodule: Secondary | ICD-10-CM | POA: Insufficient documentation

## 2021-12-29 LAB — CBC WITH DIFFERENTIAL/PLATELET
Abs Immature Granulocytes: 0.02 10*3/uL (ref 0.00–0.07)
Basophils Absolute: 0 10*3/uL (ref 0.0–0.1)
Basophils Relative: 0 %
Eosinophils Absolute: 0.2 10*3/uL (ref 0.0–0.5)
Eosinophils Relative: 3 %
HCT: 41.9 % (ref 39.0–52.0)
Hemoglobin: 14.5 g/dL (ref 13.0–17.0)
Immature Granulocytes: 0 %
Lymphocytes Relative: 24 %
Lymphs Abs: 1.8 10*3/uL (ref 0.7–4.0)
MCH: 31.2 pg (ref 26.0–34.0)
MCHC: 34.6 g/dL (ref 30.0–36.0)
MCV: 90.1 fL (ref 80.0–100.0)
Monocytes Absolute: 0.5 10*3/uL (ref 0.1–1.0)
Monocytes Relative: 6 %
Neutro Abs: 5 10*3/uL (ref 1.7–7.7)
Neutrophils Relative %: 67 %
Platelets: 313 10*3/uL (ref 150–400)
RBC: 4.65 MIL/uL (ref 4.22–5.81)
RDW: 12.7 % (ref 11.5–15.5)
WBC: 7.5 10*3/uL (ref 4.0–10.5)
nRBC: 0 % (ref 0.0–0.2)

## 2021-12-29 LAB — BASIC METABOLIC PANEL
Anion gap: 7 (ref 5–15)
BUN: 16 mg/dL (ref 6–20)
CO2: 24 mmol/L (ref 22–32)
Calcium: 8.9 mg/dL (ref 8.9–10.3)
Chloride: 109 mmol/L (ref 98–111)
Creatinine, Ser: 0.91 mg/dL (ref 0.61–1.24)
GFR, Estimated: >60 mL/min (ref >60)
Glucose, Bld: 102 mg/dL — ABNORMAL HIGH (ref 70–99)
Potassium: 3.5 mmol/L (ref 3.5–5.1)
Sodium: 140 mmol/L (ref 135–145)

## 2021-12-29 MED ORDER — SODIUM CHLORIDE 0.9 % IV BOLUS
1000.0000 mL | Freq: Once | INTRAVENOUS | Status: AC
  Administered 2021-12-29: 1000 mL via INTRAVENOUS

## 2021-12-29 MED ORDER — ONDANSETRON HCL 4 MG/2ML IJ SOLN
4.0000 mg | Freq: Once | INTRAMUSCULAR | Status: AC
  Administered 2021-12-29: 4 mg via INTRAVENOUS
  Filled 2021-12-29: qty 2

## 2021-12-29 MED ORDER — MORPHINE SULFATE (PF) 4 MG/ML IV SOLN
4.0000 mg | Freq: Once | INTRAVENOUS | Status: AC
  Administered 2021-12-29: 4 mg via INTRAVENOUS
  Filled 2021-12-29: qty 1

## 2021-12-29 MED ORDER — IOHEXOL 300 MG/ML  SOLN
100.0000 mL | Freq: Once | INTRAMUSCULAR | Status: AC | PRN
  Administered 2021-12-29: 100 mL via INTRAVENOUS
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
## 2021-12-29 NOTE — ED Notes (Signed)
Pt ambulated to the bathroom. Pt is able to walk alone.
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
## 2021-12-29 NOTE — ED Notes (Signed)
Patient transported to CT 

## 2021-12-29 NOTE — ED Notes (Signed)
[place] applied in triage

## 2022-01-11 ENCOUNTER — Ambulatory Visit (HOSPITAL_COMMUNITY)
Admission: EM | Admit: 2022-01-11 | Discharge: 2022-01-11 | Disposition: A | Discharge status: Home / Self Care | Payer: Commercial Managed Care - HMO | Attending: Physician Assistant | Admitting: Physician Assistant

## 2022-01-11 ENCOUNTER — Encounter (HOSPITAL_COMMUNITY): Payer: Self-pay

## 2022-01-11 DIAGNOSIS — M62838 Other muscle spasm: Secondary | ICD-10-CM

## 2022-01-11 DIAGNOSIS — M542 Cervicalgia: Secondary | ICD-10-CM

## 2022-01-11 MED ORDER — NAPROXEN 500 MG PO TABS: 500.0000 mg | ORAL_TABLET | Freq: Two times a day (BID) | ORAL | 0 refills | Status: AC

## 2022-01-11 MED ORDER — BACLOFEN 10 MG PO TABS: 10.0000 mg | ORAL_TABLET | Freq: Three times a day (TID) | ORAL | 0 refills | Status: DC
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
## 2022-05-28 ENCOUNTER — Other Ambulatory Visit: Payer: Self-pay | Admitting: *Deleted

## 2022-05-28 DIAGNOSIS — R109 Unspecified abdominal pain: Secondary | ICD-10-CM

## 2022-06-25 ENCOUNTER — Ambulatory Visit
Admission: RE | Admit: 2022-06-25 | Discharge: 2022-06-25 | Disposition: A | Discharge status: Home / Self Care | Payer: Commercial Managed Care - HMO | Source: Ambulatory Visit | Attending: *Deleted | Admitting: *Deleted

## 2022-06-25 DIAGNOSIS — R109 Unspecified abdominal pain: Secondary | ICD-10-CM

## 2022-06-25 MED ORDER — IOPAMIDOL (ISOVUE-300) INJECTION 61%
100.0000 mL | Freq: Once | INTRAVENOUS | Status: AC | PRN
  Administered 2022-06-25: 100 mL via INTRAVENOUS

## 2022-10-04 DIAGNOSIS — M889 Osteitis deformans of unspecified bone: Secondary | ICD-10-CM

## 2022-10-04 HISTORY — DX: Osteitis deformans of unspecified bone: M88.9

## 2022-10-10 ENCOUNTER — Other Ambulatory Visit (HOSPITAL_COMMUNITY): Payer: Self-pay | Admitting: Rheumatology

## 2022-10-10 DIAGNOSIS — M889 Osteitis deformans of unspecified bone: Secondary | ICD-10-CM

## 2022-10-28 ENCOUNTER — Encounter (HOSPITAL_COMMUNITY)
Admission: RE | Admit: 2022-10-28 | Discharge: 2022-10-28 | Disposition: A | Discharge status: Home / Self Care | Payer: Commercial Managed Care - HMO | Source: Ambulatory Visit | Attending: Rheumatology | Admitting: Rheumatology

## 2022-10-28 DIAGNOSIS — M889 Osteitis deformans of unspecified bone: Secondary | ICD-10-CM

## 2022-10-28 MED ORDER — TECHNETIUM TC 99M MEDRONATE IV KIT
20.0000 | PACK | Freq: Once | INTRAVENOUS | Status: AC | PRN
  Administered 2022-10-28: 20 via INTRAVENOUS

## 2022-12-24 NOTE — Progress Notes (Signed)
Todd Clayton    [id number]    March 30, 1971  Primary Care Physician:Patient, No Pcp Per  Referring Physician: No referring provider defined for this encounter.  Chief complaint:  Generalized abdominal pain Chief Complaint  Patient presents with   Abdominal Pain    On and off, LLQ, onset 2 to 3 months   Bloated    Onset 2 to 3 months   Nausea    ocsasionally    HPI: 61- yr M here with c/o abdominal pain.  Last seen on 12-04-15 for similar symptoms.  Accompanied by his wife.   Today, complains of intermittent LLQ abdominal pain for 2-3 months with associated bloating. He is not having pain in other areas of the abdomen. He is having normal daily BM's. Reports experiencing nausea once a month and states his episodes would last for several hours.  We reviewed his most recent colonoscopy and discussed repeating this before 2025 recall if symptoms fail to improve.   Has some pain in his left leg. We reviewed a recent CT that showed arthritis. He has seen a specialist for this and has not started injections for this. Denies swelling in legs.  Patient denies diarrhea, constipation, blood in stool, black stool, vomiting, unintentional weight loss, reflux, dysphagia.  GI Hx: CT Abdomen Pelvis w contrast 06-25-22 1. No acute abnormality in the abdomen or pelvis. 2. Colonic diverticulosis without findings of acute diverticulitis. 3. 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 4. Hepatic cysts and additional hypodense hepatic lesions which are technically too small to accurately characterize but likely reflect additional cysts. 5.  Aortic Atherosclerosis (ICD10-I70.0).  CT chest Abdomen Pelvis w contrast 12-29-21 No acute abnormality is noted to correspond with the given clinical history.  Colonoscopy 03-20-21 - One 12 mm polyp in the ascending colon, removed with a hot snare. Resected and retrieved.   - Diverticulosis in the sigmoid colon, in the descending colon, in the transverse colon and in the ascending colon.  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 Resected and retrieved.  - Diverticulosis in the sigmoid colon, in the descending colon, in the transverse colon and in the ascending colon.  - Non-bleeding internal hemorrhoids.  - The examination was otherwise normal. Surgical [P], descending, polyp - TUBULAR ADENOMA (X 1). - NO HIGH GRADE DYSPLASIA OR MALIGNANCY IDENTIFIED.   Current Outpatient Medications:    acetaminophen (TYLENOL) 325 MG tablet, Take 2 tablets (650 mg total) by mouth every 6 (six) hours as needed., Disp: , Rfl:    cetirizine (ZYRTEC) 10 MG tablet, Take 10 mg by mouth daily as needed for allergies., Disp: , Rfl:     Allergies as of 12/29/2022   (No Known Allergies)    Past Medical History:  Diagnosis Date   Allergy    Hyperlipidemia    Hypertension    Kidney stones    Medical history non-contributory     Past Surgical History:  Procedure Laterality Date   COLONOSCOPY  12/19/2015   Dr.Irja Wheless   LITHOTRIPSY     POLYPECTOMY      Family History  Problem Relation Age of Onset   Colon cancer Mother        10's   Stomach cancer Sister  Liver cancer Brother    Esophageal cancer Neg Hx    Rectal cancer Neg Hx    Cervical cancer Mother     Social History   Socioeconomic History   Marital status: Single    Spouse name: Not on file   Number of children: Not on file   Years of education: Not on file   Highest education level: Not on file  Occupational History   Not on file  Tobacco Use   Smoking status: Former    Types: Cigarettes   Smokeless tobacco: Never  Vaping Use   Vaping status: Never Used  Substance and Sexual Activity   Alcohol use:  Yes    Alcohol/week: 1.0 standard drink of alcohol    Types: 1 Cans of beer per week    Comment: occasionally on weekends    Drug use: Never   Sexual activity: Not on file  Other Topics Concern   Not on file  Social History Narrative   ** Merged History Encounter **       ** Merged History Encounter **       Social Determinants of Health   Financial Resource Strain: Not on file  Food Insecurity: No Food Insecurity (01/09/2021)   Received from [facility]   Hunger Vital Sign    Worried About Running Out of Food in the Last Year: Never true    Ran Out of Food in the Last Year: Never true  Transportation Needs: Not on file  Physical Activity: Not on file  Stress: Not on file  Social Connections: Unknown (09/17/2021)   Received from [facility]   Social Network    Social Network: Not on file  Intimate Partner Violence: Unknown (08/09/2021)   Received from [facility]   HITS    Physically Hurt: Not on file    Insult or Talk Down To: Not on file    Threaten Physical Harm: Not on file    Scream or Curse: Not on file    Review of systems: Review of Systems  Constitutional:  Negative for unexpected weight change.  HENT:  Negative for trouble swallowing.   Cardiovascular:  Negative for leg swelling.  Gastrointestinal:  Positive for abdominal pain (LLQ) and nausea. Negative for abdominal distention, anal bleeding, blood in stool, constipation, diarrhea, rectal pain and vomiting.      Physical Exam: Vitals:   12/29/22 1044  Pulse: 95  SpO2: 98%    General: well-appearing   Eyes: sclera anicteric, no redness ENT: oral mucosa moist without lesions, no cervical or supraclavicular lymphadenopathy CV: RRR, no JVD, no peripheral edema Resp: clear to auscultation bilaterally, normal RR and effort noted GI: RLQ and LLQ tenderness, soft, with active bowel sounds. No guarding or palpable organomegaly noted. Skin; warm and dry, no rash or jaundice noted Neuro: awake, alert and  oriented x 3. Normal gross motor function and fluent speech   Data Reviewed:  Reviewed chart in epic   Assessment and Plan/Recommendations:  52 year old male with no significant past medical history here with complaints of generalized abdominal pain associated with bloating. He also has significant family history of colon cancer He is due for surveillance colonoscopy in 2025 Abdominal distention and bloating likely secondary to IBS, discussed dietary modification, small frequent meals, avoid high carb or drinks with high fructose corn syrup or soda Increase dietary fiber and water intake Trial of dicyclomine 10 mg up to 3 times daily as needed  If continues to have persistent symptoms, will consider  proceeding with colonoscopy earlier for evaluation of abdominal pain  GERD: Use Pepcid 20 mg twice daily as needed Will avoid PPI given recent diagnosis of osteoporosis Antireflux measures Use FD guard 1 capsule up to 3 times daily as needed for dyspepsia symptoms  History of osteoporosis, ?  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 The patient was provided an opportunity to ask questions and all were answered. The patient agreed with the plan and demonstrated an understanding of the instructions.   I,Safa M Kadhim,acting as a scribe for Marsa Aris, MD.,have documented all relevant documentation on the behalf of Marsa Aris, MD,as directed by  Marsa Aris, MD while in the presence of Marsa Aris, MD.   I, Marsa Aris, MD, have reviewed all documentation for this visit. The documentation on 12/29/22 for the exam, diagnosis, procedures, and orders are all accurate and complete.   Iona Beard ,  MD   CC: No ref. provider found

## 2022-12-29 ENCOUNTER — Ambulatory Visit: Payer: Commercial Managed Care - HMO | Admitting: Gastroenterology

## 2022-12-29 ENCOUNTER — Encounter: Payer: Self-pay | Admitting: Gastroenterology

## 2022-12-29 VITALS — BP 132/84 | HR 95 | Ht 67.0 in | Wt 191.0 lb

## 2022-12-29 DIAGNOSIS — R140 Abdominal distension (gaseous): Secondary | ICD-10-CM

## 2022-12-29 DIAGNOSIS — R1031 Right lower quadrant pain: Secondary | ICD-10-CM | POA: Diagnosis not present

## 2022-12-29 DIAGNOSIS — R1032 Left lower quadrant pain: Secondary | ICD-10-CM

## 2022-12-29 MED ORDER — FAMOTIDINE 20 MG PO TABS: 20.0000 mg | ORAL_TABLET | Freq: Two times a day (BID) | ORAL | 3 refills | Status: AC

## 2022-12-29 MED ORDER — DICYCLOMINE HCL 10 MG PO CAPS: 10.0000 mg | ORAL_CAPSULE | Freq: Three times a day (TID) | ORAL | 0 refills | Status: AC | PRN

## 2022-12-29 NOTE — Patient Instructions (Addendum)
We have sent the following medications to your pharmacy for you to pick up at your convenience: Dicyclomine  Pepcid  Use Over the Counter FDgard 1 capsule three times a day as needed  Follow up with Endocrine for Pagets Disease for osteoporosis    Follow up in 6 months  _______________________________________________________  If your blood pressure at your visit was 140/90 or greater, please contact your primary care physician to follow up on this.  _______________________________________________________  If you are age 25 or older, your body mass index should be between 23-30. Your Body mass index is 29.91 kg/m. If this is out of the aforementioned range listed, please consider follow up with your Primary Care Provider.  If you are age 31 or younger, your body mass index should be between 19-25. Your Body mass index is 29.91 kg/m. If this is out of the aformentioned range listed, please consider follow up with your Primary Care Provider.   ________________________________________________________  The [facility] providers would like to encourage you to use [facility] to communicate with providers for non-urgent requests or questions.  Due to long hold times on the telephone, sending your provider a message by [facility] may be a faster and more efficient way to get a response.  Please allow 48 business hours for a response.  Please remember that this is for non-urgent requests.  _______________________________________________________   I appreciate the  opportunity to care for you  Thank You   Marsa Aris , MD

## 2023-01-09 ENCOUNTER — Encounter: Payer: Self-pay | Admitting: Gastroenterology

## 2023-03-24 ENCOUNTER — Encounter: Payer: Self-pay | Admitting: Physician Assistant

## 2023-03-24 ENCOUNTER — Telehealth: Payer: Managed Care, Other (non HMO) | Admitting: Physician Assistant

## 2023-03-24 VITALS — BP 138/83 | HR 98 | Temp 98.0°F

## 2023-03-24 DIAGNOSIS — J00 Acute nasopharyngitis [common cold]: Secondary | ICD-10-CM

## 2023-03-24 LAB — POC COVID19 BINAXNOW: SARS Coronavirus 2 Ag: NEGATIVE

## 2023-03-24 MED ORDER — CETIRIZINE HCL 10 MG PO TABS: 10.0000 mg | ORAL_TABLET | Freq: Every day | ORAL | 1 refills | Status: AC | PRN

## 2023-03-24 MED ORDER — CETIRIZINE HCL 10 MG PO TABS: 10.0000 mg | ORAL_TABLET | Freq: Every day | ORAL | 1 refills | Status: DC | PRN

## 2023-03-24 NOTE — Patient Instructions (Signed)
 To help with your symptoms, you are going to take Zyrtec once daily.  I sent this to the pharmacy for you.  However it may be more expensive for you to purchase the generic version over-the-counter.  I encourage you to stay very well-hydrated and get plenty of rest.  Please let us know if there is anything else we can do for you.  Roney Jaffe, PA-C Physician Assistant [facility] [email or web address]   Nonallergic Rhinitis Nonallergic rhinitis is inflammation of the mucous membrane inside the nose. The mucous membrane is the tissue that produces mucus. This condition is different from having allergic rhinitis, which is an allergy that affects the nose. Allergic rhinitis occurs when the body's defense system, or immune system, reacts to a substance that a person is allergic to (allergen), such as pollen, pet dander, mold, or dust. Nonallergic rhinitis has many similar symptoms, but it is not caused by allergens. Nonallergic rhinitis can be an acute or chronic problem. This means it can be short-term or long-term. What are the causes? This condition may be caused by many different things. Some common types of nonallergic rhinitis include: Infectious rhinitis. This is usually caused by an infection in the nose, throat, or upper airways (upper respiratory system). Vasomotor rhinitis. This is the most common type. It is caused by too much blood flow through your nose, and makes your nose swell. It is triggered by strong odors, cold air, stress, drinking alcohol, cigarette smoke, or changes in the weather. Occupational rhinitis. This type is caused by triggers in the workplace, such as chemicals, dust, animal dander, or air pollution. Hormonal rhinitis, in male teens and adults. This type is caused by an increase in the hormone estrogen and may happen during pregnancy, puberty, or monthly menstrual periods. Hormonal rhinitis gives you fewer  symptoms when estrogen levels drop. Drug-induced rhinitis. Several types of medicines can cause this, such as medicines for high blood pressure or heart disease, aspirin, or NSAIDs. Nonallergic rhinitis with eosinophilia syndrome (NARES). This type is caused by having too much eosinophil, a type of white blood cell. Other causes include a reaction to eating hot or spicy foods. This does not usually cause long-term symptoms. In some cases, the cause of nonallergic rhinitis is not known. What increases the risk? You are more likely to develop this condition if: You are 46-47 years of age. You are male. People who are male are twice as likely to have this condition. What are the signs or symptoms? Common symptoms of this condition include: Stuffy nose (nasal congestion). Runny nose. A feeling of mucus dripping down the back of your throat (postnasal drip). Trouble sleeping. Tiredness, or fatigue. Other symptoms include: Sneezing. Coughing. Itchy nose. Bloodshot eyes. How is this diagnosed? This type may be diagnosed based on: Your symptoms and medical history. A physical exam. Allergy testing to rule out allergic rhinitis. You may have skin tests or blood tests. Your health care provider may also take a swab of nasal discharge to look for an increased number of eosinophils. This is done to confirm a diagnosis of NARES. How is this treated? Treatment for this condition depends on the cause. No single treatment works for everyone. Work with your provider to find the best treatment for you. Treatment may include: Avoiding the things that trigger your symptoms. Medicines to relieve congestion, such as: Steroid nasal spray. There are many types. You may need to try a few to find out which one works best. Engineer, civil (consulting) medicine. This  treats nasal congestion and may be given by mouth or as a nasal spray. These medicines are used only for a short time. Medicines to relieve a runny nose.  These may include antihistamine medicines or decongestant nasal sprays. Nasal irrigation. This involves using a salt-water (saline) spray or saline container called a neti pot. Nasal irrigation helps to clear away mucus and keep your nasal passages moist. Surgery to remove part of your mucous membrane. This is done in severe cases if the condition has not improved after 6-12 months of treatment. Follow these instructions at home: Medicines Take or use over-the-counter and prescription medicines only as told by your provider. Do not stop using your medicine even if you start to feel better. Do not take NSAIDs, such as ibuprofen, or medicines that contain aspirin if they make your symptoms worse. Lifestyle Do not drink alcohol if it makes your symptoms worse. Do not use any products that contain nicotine or tobacco. These products include cigarettes, chewing tobacco, and vaping devices, such as e-cigarettes. If you need help quitting, ask your provider. Avoid secondhand smoke. General instructions Avoid triggers that make your symptoms worse. Use nasal irrigation as told by your provider. Sleep with the head of your bed raised. This may reduce nasal congestion when you sleep. Drink enough fluid to keep your pee (urine) pale yellow. Contact a health care provider if: You have a fever. Your symptoms are getting worse at home. Your symptoms do not lessen with medicine. You develop new symptoms, especially a headache or nosebleed. Get help right away if: You have difficulty breathing. This symptom may be an emergency. Get help right away. Call 911. Do not wait to see if the symptoms will go away. Do not drive yourself to the hospital. This information is not intended to replace advice given to you by your health care provider. Make sure you discuss any questions you have with your health care provider. Document Revised: 12/24/2021 Document Reviewed: 12/24/2021 Elsevier Patient Education  2024  [employer].

## 2023-03-24 NOTE — Progress Notes (Signed)
 New Patient Office Visit  Subjective    Patient ID: Todd Clayton, male    DOB: July 21, 1970  Age: 52 y.o. MRN: [id number]  CC:  Chief Complaint  Patient presents with   Sore Throat    Symptoms started yesterday evening   Headache    Symptoms started yesterday evening   Virtual Visit via Video Note  I connected with Hetty Ely on 03/24/23 at  1:20 PM EST by a video enabled telemedicine application and verified that I am speaking with the correct person using two identifiers.  Location: Patient: car  Provider: [facility]     I discussed the limitations of evaluation and management by telemedicine and the availability of in person appointments. The patient expressed understanding and agreed to proceed.  History of Present Illness:  HPI Todd Clayton states that he started experiencing a itchy sore throat and a headache last night.  Denies any other respiratory symptoms.  States that he has been using Tylenol Cold and flu with modest relief.  States that other than his wife who has similar symptoms with similar timing, denies sick contacts.  States that he has been eating and drinking okay.     Observations/Objective: Medical history and current medications reviewed, no physical exam completed   Outpatient Encounter Medications as of 03/24/2023  Medication Sig   famotidine (PEPCID) 20 MG tablet Take 1 tablet (20 mg total) by mouth 2 (two) times daily.   [DISCONTINUED] cetirizine (ZYRTEC) 10 MG tablet Take 10 mg by mouth daily as needed for allergies.   acetaminophen (TYLENOL) 325 MG tablet Take 2 tablets (650 mg total) by mouth every 6 (six) hours as needed. (Patient not taking: Reported on 03/24/2023)   cetirizine (ZYRTEC) 10 MG tablet Take 1 tablet (10 mg total) by mouth daily as needed for allergies.   dicyclomine (BENTYL) 10 MG capsule Take 1 capsule (10 mg total) by mouth every 8 (eight) hours as needed for spasms. (Patient  not taking: Reported on 03/24/2023)   [DISCONTINUED] cetirizine (ZYRTEC) 10 MG tablet Take 1 tablet (10 mg total) by mouth daily as needed for allergies.   No facility-administered encounter medications on file as of 03/24/2023.    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   Bone density    Past Surgical History:  Procedure Laterality Date   COLONOSCOPY  12/19/2015   Dr.Nandigam   LITHOTRIPSY     POLYPECTOMY      Family History  Problem Relation Age of Onset   Colon cancer Mother        66's   Stomach cancer Sister    Liver cancer Brother    Esophageal cancer Neg Hx    Rectal cancer Neg Hx    Cervical cancer Mother     Social History   Socioeconomic History   Marital status: Single    Spouse name: Not on file   Number of children: Not on file   Years of education: Not on file   Highest education level: Not on file  Occupational History   Not on file  Tobacco Use   Smoking status: Former    Types: Cigarettes   Smokeless tobacco: Never  Vaping Use   Vaping status: Never Used  Substance and Sexual Activity   Alcohol use: Yes  Alcohol/week: 1.0 standard drink of alcohol    Types: 1 Cans of beer per week    Comment: occasionally on weekends    Drug use: Never   Sexual activity: Not on file  Other Topics Concern   Not on file  Social History Narrative   ** Merged History Encounter **       ** Merged History Encounter **       Social Determinants of Health   Financial Resource Strain: Not on file  Food Insecurity: No Food Insecurity (01/09/2021)   [facility]   Hunger Vital Sign    Worried About Running Out of Food in the Last Year: Never true    Ran Out of Food in the Last Year: Never true  Transportation Needs: Not on file  Physical Activity: Not on file  Stress: Not on file  Social  Connections: Unknown (09/17/2021)   Received from [facility], [facility]   Social Network    Social Network: Not on file  Intimate Partner Violence: Unknown (08/09/2021)   Received from [facility], [facility]   HITS    Physically Hurt: Not on file    Insult or Talk Down To: Not on file    Threaten Physical Harm: Not on file    Scream or Curse: Not on file    Review of Systems  Constitutional:  Negative for chills and fever.  HENT:  Positive for sore throat. Negative for congestion, ear pain and sinus pain.   Eyes: Negative.   Respiratory:  Negative for cough and shortness of breath.   Cardiovascular:  Negative for chest pain.  Gastrointestinal:  Negative for abdominal pain, nausea and vomiting.  Genitourinary: Negative.   Musculoskeletal:  Negative for myalgias.  Skin: Negative.   Neurological:  Positive for headaches.  Endo/Heme/Allergies: Negative.   Psychiatric/Behavioral: Negative.          Objective    BP 138/83 (BP Location: Left Arm, Patient Position: Sitting, Cuff Size: Large)   Pulse 98   Temp 98 F (36.7 C)     Assessment & Plan:   Problem List Items Addressed This Visit   None Visit Diagnoses     Acute rhinitis    -  Primary   Relevant Medications   cetirizine (ZYRTEC) 10 MG tablet   Other Relevant Orders   POC COVID-19 (Completed)       Assessment and Plan: 1. Acute rhinitis Rapid COVID negative.  Trial Zyrtec.  Patient education given on supportive care.  Red flags given for prompt reevaluation. - POC COVID-19 - cetirizine (ZYRTEC) 10 MG tablet; Take 1 tablet (10 mg total) by mouth daily as needed for allergies.  Dispense: 30 tablet; Refill: 1   Follow Up Instructions:    I discussed the assessment and treatment plan with the patient. The patient was provided an opportunity to ask questions and all were answered. The patient agreed with the plan and demonstrated an understanding of the instructions.   The patient was advised to  call back or seek an in-person evaluation if the symptoms worsen or if the condition fails to improve as anticipated.  I provided 15 minutes of non-face-to-face time during this encounter.   Return if symptoms worsen or fail to improve.   Todd Knudsen Mayers, PA-C

## 2023-08-08 ENCOUNTER — Emergency Department (HOSPITAL_COMMUNITY)

## 2023-08-08 ENCOUNTER — Encounter (HOSPITAL_COMMUNITY): Payer: Self-pay | Admitting: *Deleted

## 2023-08-08 ENCOUNTER — Other Ambulatory Visit: Payer: Self-pay

## 2023-08-08 ENCOUNTER — Emergency Department (HOSPITAL_COMMUNITY)
Admission: EM | Admit: 2023-08-08 | Discharge: 2023-08-09 | Disposition: A | Discharge status: Home / Self Care | Attending: Emergency Medicine | Admitting: Emergency Medicine

## 2023-08-08 DIAGNOSIS — N201 Calculus of ureter: Secondary | ICD-10-CM

## 2023-08-08 DIAGNOSIS — Z87891 Personal history of nicotine dependence: Secondary | ICD-10-CM | POA: Insufficient documentation

## 2023-08-08 DIAGNOSIS — N202 Calculus of kidney with calculus of ureter: Secondary | ICD-10-CM | POA: Insufficient documentation

## 2023-08-08 DIAGNOSIS — I10 Essential (primary) hypertension: Secondary | ICD-10-CM | POA: Insufficient documentation

## 2023-08-08 DIAGNOSIS — R109 Unspecified abdominal pain: Secondary | ICD-10-CM

## 2023-08-08 LAB — COMPREHENSIVE METABOLIC PANEL WITH GFR
ALT: 38 U/L (ref 0–44)
AST: 25 U/L (ref 15–41)
Albumin: 4.2 g/dL (ref 3.5–5.0)
Alkaline Phosphatase: 51 U/L (ref 38–126)
Anion gap: 11 (ref 5–15)
BUN: 19 mg/dL (ref 6–20)
CO2: 22 mmol/L (ref 22–32)
Calcium: 9 mg/dL (ref 8.9–10.3)
Chloride: 104 mmol/L (ref 98–111)
Creatinine, Ser: 1.08 mg/dL (ref 0.61–1.24)
GFR, Estimated: >60 mL/min (ref >60)
Glucose, Bld: 107 mg/dL — ABNORMAL HIGH (ref 70–99)
Potassium: 3.7 mmol/L (ref 3.5–5.1)
Sodium: 137 mmol/L (ref 135–145)
Total Bilirubin: 1.1 mg/dL (ref 0.0–1.2)
Total Protein: 7 g/dL (ref 6.5–8.1)

## 2023-08-08 LAB — URINALYSIS, ROUTINE W REFLEX MICROSCOPIC
Bilirubin Urine: NEGATIVE
Glucose, UA: NEGATIVE mg/dL
Ketones, ur: NEGATIVE mg/dL
Leukocytes,Ua: NEGATIVE
Nitrite: NEGATIVE
Protein, ur: 100 mg/dL — AB
RBC / HPF: >50 RBC/hpf (ref 0–5)
Specific Gravity, Urine: 1.028 (ref 1.005–1.030)
pH: 5 (ref 5.0–8.0)

## 2023-08-08 LAB — CBC
HCT: 42.3 % (ref 39.0–52.0)
Hemoglobin: 14.7 g/dL (ref 13.0–17.0)
MCH: 30.8 pg (ref 26.0–34.0)
MCHC: 34.8 g/dL (ref 30.0–36.0)
MCV: 88.7 fL (ref 80.0–100.0)
Platelets: 318 10*3/uL (ref 150–400)
RBC: 4.77 MIL/uL (ref 4.22–5.81)
RDW: 12.5 % (ref 11.5–15.5)
WBC: 10.2 10*3/uL (ref 4.0–10.5)
nRBC: 0 % (ref 0.0–0.2)

## 2023-08-08 MED ORDER — OXYCODONE-ACETAMINOPHEN 5-325 MG PO TABS: 1.0000 | ORAL_TABLET | Freq: Once | ORAL | Status: DC

## 2023-08-08 MED ORDER — ONDANSETRON 4 MG PO TBDP
4.0000 mg | ORAL_TABLET | Freq: Once | ORAL | Status: AC | PRN
  Administered 2023-08-08: 4 mg via ORAL
  Filled 2023-08-08: qty 1

## 2023-08-08 NOTE — ED Triage Notes (Signed)
 The pt reports that he has taken ocycodone 30 minutes ago

## 2023-08-08 NOTE — ED Triage Notes (Signed)
 Lt flank pain for one week the pain is  worse today with nausea

## 2023-08-09 MED ORDER — TAMSULOSIN HCL 0.4 MG PO CAPS: 0.4000 mg | ORAL_CAPSULE | Freq: Every day | ORAL | 0 refills | Status: AC

## 2023-08-09 MED ORDER — KETOROLAC TROMETHAMINE 30 MG/ML IJ SOLN
30.0000 mg | Freq: Once | INTRAMUSCULAR | Status: AC
  Administered 2023-08-09: 30 mg via INTRAMUSCULAR
  Filled 2023-08-09: qty 1

## 2023-08-09 MED ORDER — ONDANSETRON 4 MG PO TBDP: 4.0000 mg | ORAL_TABLET | Freq: Three times a day (TID) | ORAL | 0 refills | Status: AC | PRN

## 2023-08-09 MED ORDER — OXYCODONE HCL 5 MG PO TABS: 5.0000 mg | ORAL_TABLET | Freq: Four times a day (QID) | ORAL | 0 refills | Status: DC | PRN

## 2023-08-09 MED ORDER — KETOROLAC TROMETHAMINE 10 MG PO TABS: 10.0000 mg | ORAL_TABLET | Freq: Four times a day (QID) | ORAL | 0 refills | Status: DC | PRN

## 2023-08-09 NOTE — Discharge Instructions (Addendum)
 As discussed, you do have evidence of kidney stone on your CT scan which is most likely causing your symptoms.  Will send you home with medicine for pain, nausea as well as medicine to help with aid in passage of the stone.  Call number attached to discharge papers to schedule appointment with urologist.  Return to [facility] emergency department if you develop fever, intractable pain, intractable nausea as this is a hospital with the urology specialist.

## 2023-08-09 NOTE — ED Provider Notes (Signed)
 [place] EMERGENCY DEPARTMENT AT [facility] Provider Note   CSN: [id number] Arrival date & time: 08/08/23  1938     History  Chief Complaint  Patient presents with   Flank Pain    Todd Clayton is a 53 y.o. male.   Flank Pain   53 year old male presents emergency department complaint of left-sided flank pain.  States that symptoms been intermittent for the past week or so with acute worsening today.  Reports nausea with no emesis.  Denies any fevers, chills, urinary symptoms, change in bowel habits.  Does state that his urine appeared slightly darker today.  Reports history of kidney stones but has been 10 to 12 years.  Did have to have procedural intervention at that time.  Presents emergency department for reassessment.  Past medical history significant for kidney stone, hypertension, hyperlipidemia, Paget's disease  Home Medications Prior to Admission medications   Medication Sig Start Date End Date Taking? Authorizing Provider  ketorolac (TORADOL) 10 MG tablet Take 1 tablet (10 mg total) by mouth every 6 (six) hours as needed. 08/09/23  Yes Sherian Maroon A, PA  ondansetron (ZOFRAN-ODT) 4 MG disintegrating tablet Take 1 tablet (4 mg total) by mouth every 8 (eight) hours as needed for nausea or vomiting. 08/09/23  Yes Sherian Maroon A, PA  oxyCODONE (ROXICODONE) 5 MG immediate release tablet Take 1 tablet (5 mg total) by mouth every 6 (six) hours as needed for severe pain (pain score 7-10). 08/09/23  Yes Sherian Maroon A, PA  tamsulosin (FLOMAX) 0.4 MG CAPS capsule Take 1 capsule (0.4 mg total) by mouth daily. 08/09/23  Yes Sherian Maroon A, PA  acetaminophen (TYLENOL) 325 MG tablet Take 2 tablets (650 mg total) by mouth every 6 (six) hours as needed. Patient not taking: Reported on 03/24/2023 02/21/13   Christiane Ha, MD  cetirizine (ZYRTEC) 10 MG tablet Take 1 tablet (10 mg total) by mouth daily as needed for allergies. 03/24/23   Mayers, Cari S, PA-C   dicyclomine (BENTYL) 10 MG capsule Take 1 capsule (10 mg total) by mouth every 8 (eight) hours as needed for spasms. Patient not taking: Reported on 03/24/2023 12/29/22   Napoleon Form, MD  famotidine (PEPCID) 20 MG tablet Take 1 tablet (20 mg total) by mouth 2 (two) times daily. 12/29/22   Napoleon Form, MD      Allergies    Patient has no known allergies.    Review of Systems   Review of Systems  Genitourinary:  Positive for flank pain.  All other systems reviewed and are negative.   Physical Exam Updated Vital Signs BP (!) 144/97 (BP Location: Left Arm)   Pulse 79   Temp 97.9 F (36.6 C) (Oral)   Resp 18   Ht 5\' 7"  (1.702 m)   Wt 86.6 kg   SpO2 96%   BMI 29.90 kg/m  Physical Exam Vitals and nursing note reviewed.  Constitutional:      General: He is not in acute distress.    Appearance: He is well-developed.  HENT:     Head: Normocephalic and atraumatic.  Eyes:     Conjunctiva/sclera: Conjunctivae normal.  Cardiovascular:     Rate and Rhythm: Normal rate and regular rhythm.     Heart sounds: No murmur heard. Pulmonary:     Effort: Pulmonary effort is normal. No respiratory distress.     Breath sounds: Normal breath sounds.  Abdominal:     Palpations: Abdomen is soft.  Tenderness: There is no abdominal tenderness. There is left CVA tenderness.  Musculoskeletal:        General: No swelling.     Cervical back: Neck supple.  Skin:    General: Skin is warm and dry.     Capillary Refill: Capillary refill takes less than 2 seconds.  Neurological:     Mental Status: He is alert.  Psychiatric:        Mood and Affect: Mood normal.     ED Results / Procedures / Treatments   Labs (all labs ordered are listed, but only abnormal results are displayed) Labs Reviewed  COMPREHENSIVE METABOLIC PANEL WITH GFR - Abnormal; Notable for the following components:      Result Value   Glucose, Bld 107 (*)    All other components within normal limits   URINALYSIS, ROUTINE W REFLEX MICROSCOPIC - Abnormal; Notable for the following components:   Color, Urine AMBER (*)    APPearance CLOUDY (*)    Hgb urine dipstick LARGE (*)    Protein, ur 100 (*)    Bacteria, UA FEW (*)    All other components within normal limits  CBC    EKG None  Radiology CT Renal Stone Study Result Date: 08/09/2023 CLINICAL DATA:  Flank pain. EXAM: CT ABDOMEN AND PELVIS WITHOUT CONTRAST TECHNIQUE: Multidetector CT imaging of the abdomen and pelvis was performed following the standard protocol without IV contrast. RADIATION DOSE REDUCTION: This exam was performed according to the departmental dose-optimization program which includes automated exposure control, adjustment of the mA and/or kV according to patient size and/or use of iterative reconstruction technique. COMPARISON:  June 25, 2022 FINDINGS: Lower chest: No acute abnormality. Hepatobiliary: Stable 1.0 cm, 2.0 cm and 2.8 cm hepatic cysts are seen. No gallstones, gallbladder wall thickening, or biliary dilatation. Pancreas: Unremarkable. No pancreatic ductal dilatation or surrounding inflammatory changes. Spleen: Normal in size without focal abnormality. Adrenals/Urinary Tract: Adrenal glands are unremarkable. Kidneys are normal in size, without focal lesions. A 3 mm obstructing renal calculus is seen within the proximal left ureter with mild left-sided hydronephrosis and hydroureter. Bladder is unremarkable. Stomach/Bowel: Stomach is within normal limits. Appendix appears normal. No evidence of bowel wall thickening, distention, or inflammatory changes. Noninflamed diverticula are seen throughout the descending and sigmoid colon. Vascular/Lymphatic: Aortic atherosclerosis. No enlarged abdominal or pelvic lymph nodes. Reproductive: Prostate is unremarkable. Other: No abdominal wall hernia or abnormality. No abdominopelvic ascites. Musculoskeletal: Coarsened trabecula are again seen within the left iliac bone along the  left acetabulum, suggestive of Paget's disease. No acute osseous abnormalities are identified. IMPRESSION: 1. 3 mm obstructing renal calculus within the proximal left ureter. 2. Colonic diverticulosis. 3. Stable hepatic cysts. 4. Aortic atherosclerosis. Aortic Atherosclerosis (ICD10-I70.0). Electronically Signed   By: Aram Candela M.D.   On: 08/09/2023 00:07    Procedures Procedures    Medications Ordered in ED Medications  ketorolac (TORADOL) 30 MG/ML injection 30 mg (has no administration in time range)  ondansetron (ZOFRAN-ODT) disintegrating tablet 4 mg (4 mg Oral Given 08/08/23 1952)    ED Course/ Medical Decision Making/ A&P                                 Medical Decision Making Risk Prescription drug management.   This patient presents to the ED for concern of left leg pain, this involves an extensive number of treatment options, and is a complaint that carries with it a  high risk of complications and morbidity.  The differential diagnosis includes pyelonephritis, nephrolithiasis, strain/pain, cauda equina, spinal epidural abscess, aortic dissection, other   Co morbidities that complicate the patient evaluation  See HPI   Additional history obtained:  Additional history obtained from EMR External records from outside source obtained and reviewed including hospital records   Lab Tests:  I Ordered, and personally interpreted labs.  The pertinent results include: No leukocytosis.  No evidence of anemia.  Place within range.  Electra abnormalities.  No transaminitis.  No renal dysfunction.  UA with greater than 50 RBCs, few bacteria but negative nitrite/leukocyte.   Imaging Studies ordered:  I ordered imaging studies including CT renal stone study I independently visualized and interpreted imaging which showed proximal left 3 mm ureteral stone.  Diverticulosis.  Hepatic cyst.  Aortic atherosclerosis. I agree with the radiologist interpretation   Cardiac  Monitoring: / EKG:  The patient was maintained on a cardiac monitor.  I personally viewed and interpreted the cardiac monitored which showed an underlying rhythm of: Sinus rhythm   Consultations Obtained:  N/a   Problem List / ED Course / Critical interventions / Medication management  Ureterolithiasis, left flank pain I ordered medication including Zofran, Toradol   Reevaluation of the patient after these medicines showed that the patient improved I have reviewed the patients home medicines and have made adjustments as needed   Social Determinants of Health:  Former cigarette use.  Denies illicit drug use.   Test / Admission - Considered:  Ureterolithiasis, left flank pain Vitals signs significant for htn bp 144/97. Otherwise within normal range and stable throughout visit. Laboratory/imaging studies significant for: see above 53 year old male presents emergency department complaint of left-sided flank pain.  States that symptoms been intermittent for the past week or so with acute worsening today.  Reports nausea with no emesis.  Denies any fevers, chills, urinary symptoms, change in bowel habits.  Does state that his urine appeared slightly darker today.  Reports history of kidney stones but has been 10 to 12 years.  Did have to have procedural intervention at that time.  Presents emergency department for reassessment. On exam, CVA tenderness on the left side.  No anterior abdominal tenderness.  Labs unremarkable for acute emergent process.  UA with gross RBCs but without evidence of concurrent infection.  CT imaging concerning for left-sided proximal 3 mm ureteral stoneAs most likely cause of symptoms.  Patient with maintained renal function.  Will trial treatment of patient's symptoms in the outpatient setting with follow-up with urology.  Treatment plan discussed with patient and he denies understanding was agreeable to said plan.  Patient overall well-appearing, afebrile in no  acute distress. Worrisome signs and symptoms were discussed with the patient, and the patient acknowledged understanding to return to the ED if noticed. Patient was stable upon discharge.          Final Clinical Impression(s) / ED Diagnoses Final diagnoses:  Ureterolithiasis  Left flank pain    Rx / DC Orders ED Discharge Orders          Ordered    Ambulatory referral to Urology       Comments: [facility] [place]   08/09/23 0036    oxyCODONE (ROXICODONE) 5 MG immediate release tablet  Every 6 hours PRN        08/09/23 0038    ketorolac (TORADOL) 10 MG tablet  Every 6 hours PRN        08/09/23 0038  ondansetron (ZOFRAN-ODT) 4 MG disintegrating tablet  Every 8 hours PRN        08/09/23 0038    tamsulosin (FLOMAX) 0.4 MG CAPS capsule  Daily        08/09/23 0038              Peter Garter, PA 08/09/23 0050    Tilden Fossa, MD 08/09/23 [phone]

## 2023-08-25 ENCOUNTER — Inpatient Hospital Stay (HOSPITAL_COMMUNITY)

## 2023-08-25 ENCOUNTER — Other Ambulatory Visit: Payer: Self-pay

## 2023-08-25 ENCOUNTER — Inpatient Hospital Stay (HOSPITAL_COMMUNITY): Admitting: Certified Registered Nurse Anesthetist

## 2023-08-25 ENCOUNTER — Encounter (HOSPITAL_COMMUNITY): Payer: Self-pay | Admitting: Urology

## 2023-08-25 ENCOUNTER — Inpatient Hospital Stay (HOSPITAL_BASED_OUTPATIENT_CLINIC_OR_DEPARTMENT_OTHER): Admitting: Certified Registered Nurse Anesthetist

## 2023-08-25 ENCOUNTER — Other Ambulatory Visit: Payer: Self-pay | Admitting: Urology

## 2023-08-25 ENCOUNTER — Encounter (HOSPITAL_COMMUNITY)
Admission: RE | Disposition: A | Discharge status: Home / Self Care | Payer: Self-pay | Source: Home / Self Care | Attending: Urology

## 2023-08-25 ENCOUNTER — Ambulatory Visit (HOSPITAL_COMMUNITY)
Admission: RE | Admit: 2023-08-25 | Discharge: 2023-08-25 | Disposition: A | Discharge status: Home / Self Care | Attending: Urology | Admitting: Urology

## 2023-08-25 DIAGNOSIS — N132 Hydronephrosis with renal and ureteral calculous obstruction: Secondary | ICD-10-CM | POA: Insufficient documentation

## 2023-08-25 DIAGNOSIS — Z87891 Personal history of nicotine dependence: Secondary | ICD-10-CM | POA: Insufficient documentation

## 2023-08-25 DIAGNOSIS — Z87442 Personal history of urinary calculi: Secondary | ICD-10-CM | POA: Diagnosis not present

## 2023-08-25 DIAGNOSIS — I10 Essential (primary) hypertension: Secondary | ICD-10-CM | POA: Diagnosis not present

## 2023-08-25 DIAGNOSIS — K219 Gastro-esophageal reflux disease without esophagitis: Secondary | ICD-10-CM | POA: Diagnosis not present

## 2023-08-25 DIAGNOSIS — N201 Calculus of ureter: Secondary | ICD-10-CM | POA: Diagnosis present

## 2023-08-25 HISTORY — PX: CYSTOSCOPY/URETEROSCOPY/HOLMIUM LASER/STENT PLACEMENT: SHX6546

## 2023-08-25 SURGERY — CYSTOSCOPY/URETEROSCOPY/HOLMIUM LASER/STENT PLACEMENT
Anesthesia: General | Laterality: Left

## 2023-08-25 MED ORDER — DEXAMETHASONE SODIUM PHOSPHATE 10 MG/ML IJ SOLN
INTRAMUSCULAR | Status: DC | PRN
  Administered 2023-08-25: 5 mg via INTRAVENOUS

## 2023-08-25 MED ORDER — PROPOFOL 10 MG/ML IV BOLUS
INTRAVENOUS | Status: AC
  Filled 2023-08-25: qty 20

## 2023-08-25 MED ORDER — LACTATED RINGERS IV SOLN: INTRAVENOUS | Status: DC

## 2023-08-25 MED ORDER — FENTANYL CITRATE PF 50 MCG/ML IJ SOSY: 25.0000 ug | PREFILLED_SYRINGE | INTRAMUSCULAR | Status: DC | PRN

## 2023-08-25 MED ORDER — OXYCODONE HCL 5 MG PO TABS: 5.0000 mg | ORAL_TABLET | Freq: Four times a day (QID) | ORAL | 0 refills | Status: AC | PRN

## 2023-08-25 MED ORDER — LIDOCAINE HCL (PF) 2 % IJ SOLN
INTRAMUSCULAR | Status: AC
  Filled 2023-08-25: qty 5

## 2023-08-25 MED ORDER — MIDAZOLAM HCL 2 MG/2ML IJ SOLN
INTRAMUSCULAR | Status: DC | PRN
  Administered 2023-08-25: 2 mg via INTRAVENOUS

## 2023-08-25 MED ORDER — LIDOCAINE HCL (PF) 2 % IJ SOLN
INTRAMUSCULAR | Status: DC | PRN
  Administered 2023-08-25: 50 mg via INTRADERMAL

## 2023-08-25 MED ORDER — ONDANSETRON HCL 4 MG/2ML IJ SOLN: 4.0000 mg | Freq: Once | INTRAMUSCULAR | Status: DC | PRN

## 2023-08-25 MED ORDER — ACETAMINOPHEN 10 MG/ML IV SOLN
INTRAVENOUS | Status: AC
  Filled 2023-08-25: qty 100

## 2023-08-25 MED ORDER — SODIUM CHLORIDE 0.9 % IR SOLN
Status: DC | PRN
  Administered 2023-08-25: 1000 mL via INTRAVESICAL

## 2023-08-25 MED ORDER — CEPHALEXIN 500 MG PO CAPS: 500.0000 mg | ORAL_CAPSULE | Freq: Two times a day (BID) | ORAL | 0 refills | Status: AC

## 2023-08-25 MED ORDER — AMISULPRIDE (ANTIEMETIC) 5 MG/2ML IV SOLN: 10.0000 mg | Freq: Once | INTRAVENOUS | Status: DC | PRN

## 2023-08-25 MED ORDER — LIDOCAINE 2% (20 MG/ML) 5 ML SYRINGE: INTRAMUSCULAR | Status: DC | PRN

## 2023-08-25 MED ORDER — KETOROLAC TROMETHAMINE 30 MG/ML IJ SOLN
INTRAMUSCULAR | Status: DC | PRN
  Administered 2023-08-25: 30 mg via INTRAVENOUS

## 2023-08-25 MED ORDER — FENTANYL CITRATE (PF) 100 MCG/2ML IJ SOLN
INTRAMUSCULAR | Status: AC
  Filled 2023-08-25: qty 2

## 2023-08-25 MED ORDER — MIDAZOLAM HCL 2 MG/2ML IJ SOLN
INTRAMUSCULAR | Status: AC
  Filled 2023-08-25: qty 2

## 2023-08-25 MED ORDER — ONDANSETRON HCL 4 MG/2ML IJ SOLN
INTRAMUSCULAR | Status: DC | PRN
  Administered 2023-08-25: 4 mg via INTRAVENOUS

## 2023-08-25 MED ORDER — CHLORHEXIDINE GLUCONATE 0.12 % MT SOLN
15.0000 mL | Freq: Once | OROMUCOSAL | Status: AC
  Administered 2023-08-25: 15 mL via OROMUCOSAL
  Filled 2023-08-25: qty 15

## 2023-08-25 MED ORDER — KETOROLAC TROMETHAMINE 10 MG PO TABS: 10.0000 mg | ORAL_TABLET | Freq: Three times a day (TID) | ORAL | 0 refills | Status: AC | PRN

## 2023-08-25 MED ORDER — IOHEXOL 300 MG/ML  SOLN
INTRAMUSCULAR | Status: DC | PRN
  Administered 2023-08-25: 8 mL via URETHRAL

## 2023-08-25 MED ORDER — PHENYLEPHRINE 80 MCG/ML (10ML) SYRINGE FOR IV PUSH (FOR BLOOD PRESSURE SUPPORT)
PREFILLED_SYRINGE | INTRAVENOUS | Status: DC | PRN
  Administered 2023-08-25: 80 ug via INTRAVENOUS

## 2023-08-25 MED ORDER — PHENYLEPHRINE 80 MCG/ML (10ML) SYRINGE FOR IV PUSH (FOR BLOOD PRESSURE SUPPORT)
PREFILLED_SYRINGE | INTRAVENOUS | Status: AC
  Filled 2023-08-25: qty 10

## 2023-08-25 MED ORDER — ACETAMINOPHEN 10 MG/ML IV SOLN
INTRAVENOUS | Status: DC | PRN
  Administered 2023-08-25: 1000 mg via INTRAVENOUS

## 2023-08-25 MED ORDER — PROPOFOL 10 MG/ML IV BOLUS
INTRAVENOUS | Status: DC | PRN
  Administered 2023-08-25: 150 mg via INTRAVENOUS

## 2023-08-25 MED ORDER — ONDANSETRON HCL 4 MG/2ML IJ SOLN
INTRAMUSCULAR | Status: AC
  Filled 2023-08-25: qty 2

## 2023-08-25 MED ORDER — FENTANYL CITRATE (PF) 100 MCG/2ML IJ SOLN
INTRAMUSCULAR | Status: DC | PRN
  Administered 2023-08-25 (×4): 50 ug via INTRAVENOUS

## 2023-08-25 MED ORDER — DEXAMETHASONE SODIUM PHOSPHATE 10 MG/ML IJ SOLN
INTRAMUSCULAR | Status: AC
  Filled 2023-08-25: qty 1

## 2023-08-25 MED ORDER — DEXAMETHASONE SODIUM PHOSPHATE 4 MG/ML IJ SOLN
INTRAMUSCULAR | Status: DC | PRN
Start: 2023-08-25 — End: 2023-08-25

## 2023-08-25 MED ORDER — DEXTROSE 5 % IV SOLN
5.0000 mg/kg | INTRAVENOUS | Status: AC
  Administered 2023-08-25: 371.6 mg via INTRAVENOUS
  Filled 2023-08-25: qty 9.25

## 2023-08-25 SURGICAL SUPPLY — 20 items
BAG URO CATCHER STRL LF (MISCELLANEOUS) ×1 IMPLANT
BASKET LASER NITINOL 1.9FR (BASKET) IMPLANT
CATH URETL OPEN END 6FR 70 (CATHETERS) ×1 IMPLANT
CLOTH BEACON ORANGE TIMEOUT ST (SAFETY) ×1 IMPLANT
EXTRACTOR STONE 1.7FRX115CM (UROLOGICAL SUPPLIES) IMPLANT
GLOVE SURG LX STRL 7.5 STRW (GLOVE) ×1 IMPLANT
GOWN STRL REUS W/ TWL XL LVL3 (GOWN DISPOSABLE) ×1 IMPLANT
GUIDEWIRE ANG ZIPWIRE 038X150 (WIRE) ×1 IMPLANT
GUIDEWIRE STR DUAL SENSOR (WIRE) ×1 IMPLANT
KIT TURNOVER KIT A (KITS) IMPLANT
LASER FIB FLEXIVA PULSE ID 365 (Laser) IMPLANT
MANIFOLD NEPTUNE II (INSTRUMENTS) ×1 IMPLANT
PACK CYSTO (CUSTOM PROCEDURE TRAY) ×1 IMPLANT
SHEATH NAVIGATOR HD 11/13X28 (SHEATH) IMPLANT
SHEATH NAVIGATOR HD 11/13X36 (SHEATH) IMPLANT
STENT PERCUFLEX 4.8FRX24 (STENTS) IMPLANT
TRACTIP FLEXIVA PULS ID 200XHI (Laser) IMPLANT
TUBE PU 8FR 16IN ENFIT (TUBING) ×1 IMPLANT
TUBING CONNECTING 10 (TUBING) ×1 IMPLANT
TUBING UROLOGY SET (TUBING) ×1 IMPLANT

## 2023-08-25 NOTE — Brief Op Note (Signed)
 08/25/2023  4:30 PM  PATIENT:  Todd Clayton  53 y.o. male  PRE-OPERATIVE DIAGNOSIS:  OBSTRUCTING URETERAL LEFT STONE  POST-OPERATIVE DIAGNOSIS:  OBSTRUCTING URETERAL LEFT STONE  PROCEDURE:  Procedure(s): CYSTOSCOPY/URETEROSCOPY/BASKETING OF STONE/STENT PLACEMENT (Left)  SURGEON:  Surgeons and Role:    * Manny, Harvey Linen., MD - Primary  PHYSICIAN ASSISTANT:   ASSISTANTS: none   ANESTHESIA:   general  EBL:  0 mL   BLOOD ADMINISTERED:none  DRAINS: none   LOCAL MEDICATIONS USED:  NONE  SPECIMEN:  Source of Specimen:  left ureteral stone  DISPOSITION OF SPECIMEN:   [facility] for compositional analysis  COUNTS:  YES  TOURNIQUET:  * No tourniquets in log *  DICTATION: .Other Dictation: Dictation Number [id number]  PLAN OF CARE: Discharge to home after PACU  PATIENT DISPOSITION:  PACU - hemodynamically stable.   Delay start of Pharmacological VTE agent (>24hrs) due to surgical blood loss or risk of bleeding: yes

## 2023-08-25 NOTE — H&P (Signed)
 Todd Clayton is an 53 y.o. male.    Chief Complaint: Pre-OP LEFT Ureteroscopic Stone Manipulation  HPI:   1 - Recurrent Urolithiasis -  Pre 2025 - URS x 1 08/2023 - Left 3mm proximal solitary ureteral stone, top of L3 vertebral body area on CT and stable on KUB 4/22.   PMH sig for Paget's. No ischemic CV disease / blood thinners. NO PCP at present. Works as Radio broadcast assistant, his wife Baldo Bonds is very involved.   Today " Todd Clayton " is seen to proceed with LEFT ureteroscopy for small proximal stone that has not passed with 2 weeks of medical therapy.  Cr 1.1, UA without infectious parameters. Last meal yesterday. Colic difficult to control on PO meds.   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   Bone density    Past Surgical History:  Procedure Laterality Date   COLONOSCOPY  12/19/2015   Dr.Nandigam   LITHOTRIPSY     POLYPECTOMY      Family History  Problem Relation Age of Onset   Colon cancer Mother        74's   Stomach cancer Sister    Liver cancer Brother    Esophageal cancer Neg Hx    Rectal cancer Neg Hx    Cervical cancer Mother    Social History:  reports that he has quit smoking. His smoking use included cigarettes. He has never used smokeless tobacco. He reports current alcohol use of about 1.0 standard drink of alcohol per week. He reports that he does not use drugs.  Allergies: No Known Allergies  No medications prior to admission.    No results found for this or any previous visit (from the past 48 hours). No results found.  Review of Systems  Constitutional:  Negative for chills and fever.  Gastrointestinal:  Positive for nausea. Negative for vomiting.  Genitourinary:  Positive for flank pain.  All other systems reviewed and are negative.   There were no vitals taken for this visit. Physical Exam Vitals reviewed.   Constitutional:      Comments: Stable colic.   HENT:     Head: Normocephalic.     Nose: Nose normal.  Eyes:     Pupils: Pupils are equal, round, and reactive to light.  Cardiovascular:     Rate and Rhythm: Normal rate.  Abdominal:     General: Abdomen is flat.     Comments: Mild truncal obesity.   Genitourinary:    Comments: Mild left CVAT at present.  Musculoskeletal:        General: Normal range of motion.     Cervical back: Normal range of motion.  Skin:    General: Skin is warm.  Neurological:     General: No focal deficit present.     Mental Status: He is alert.  Psychiatric:        Mood and Affect: Mood normal.      Assessment/Plan  Proceed as planned with LEFT ureteroscopic stone manipulation. Risks, benefits, alternatives, expected peri-op course discussed previously and reiterated today.   Melody Spurling., MD 08/25/2023, 11:34 AM

## 2023-08-25 NOTE — Anesthesia Procedure Notes (Signed)
 Procedure Name: LMA Insertion Date/Time: 08/25/2023 4:15 PM  Performed by: Rochell Chroman, CRNAPre-anesthesia Checklist: Emergency Drugs available, Patient identified, Suction available and Patient being monitored Patient Re-evaluated:Patient Re-evaluated prior to induction Oxygen Delivery Method: Circle system utilized Preoxygenation: Pre-oxygenation with 100% oxygen Induction Type: IV induction Ventilation: Mask ventilation without difficulty LMA: LMA inserted LMA Size: 4.0 Number of attempts: 1 Placement Confirmation: positive ETCO2 and breath sounds checked- equal and bilateral Tube secured with: Tape Dental Injury: Teeth and Oropharynx as per pre-operative assessment

## 2023-08-25 NOTE — Anesthesia Preprocedure Evaluation (Addendum)
 Anesthesia Evaluation  Patient identified by MRN, date of birth, ID band Patient awake    Reviewed: Allergy & Precautions, NPO status , Patient's Chart, lab work & pertinent test results  Airway Mallampati: II  TM Distance: >3 FB Neck ROM: Full    Dental  (+) Teeth Intact, Dental Advisory Given   Pulmonary former smoker   Pulmonary exam normal breath sounds clear to auscultation       Cardiovascular hypertension, Normal cardiovascular exam Rhythm:Regular Rate:Normal     Neuro/Psych negative neurological ROS     GI/Hepatic Neg liver ROS,GERD  Medicated,,  Endo/Other  negative endocrine ROS    Renal/GU Renal disease (OBSTRUCTING URETERAL LEFT STONE)     Musculoskeletal Paget's disease of bone   Abdominal   Peds  Hematology   Anesthesia Other Findings Day of surgery medications reviewed with the patient.  Reproductive/Obstetrics                              Anesthesia Physical Anesthesia Plan  ASA: 2  Anesthesia Plan: General   Post-op Pain Management: Tylenol  PO (pre-op)* and Toradol  IV (intra-op)*   Induction: Intravenous  PONV Risk Score and Plan: 3 and Midazolam , Dexamethasone  and Ondansetron   Airway Management Planned: LMA  Additional Equipment:   Intra-op Plan:   Post-operative Plan: Extubation in OR  Informed Consent: I have reviewed the patients History and Physical, chart, labs and discussed the procedure including the risks, benefits and alternatives for the proposed anesthesia with the patient or authorized representative who has indicated his/her understanding and acceptance.     Dental advisory given  Plan Discussed with: CRNA  Anesthesia Plan Comments:         Anesthesia Quick Evaluation

## 2023-08-25 NOTE — Discharge Instructions (Addendum)
1 - You may have urinary urgency (bladder spasms) and bloody urine on / off with stent in place. This is normal.  2 - Remove tethered stent on Friday morning at home by pulling on string, then blue plastic tubing, and discarding. Office is open Friday if any issues arise.   3 - Call MD or go to ER for fever >102, severe pain / nausea / vomiting not relieved by medications, or acute change in medical status

## 2023-08-25 NOTE — OR Nursing (Signed)
 Renal stone taken by Dr. Secundino Dach.

## 2023-08-25 NOTE — Transfer of Care (Signed)
 Immediate Anesthesia Transfer of Care Note  Patient: Todd Clayton  Procedure(s) Performed: CYSTOSCOPY/URETEROSCOPY/BASKETING OF STONE/STENT PLACEMENT (Left)  Patient Location: PACU  Anesthesia Type:General  Level of Consciousness: drowsy  Airway & Oxygen Therapy: Patient Spontanous Breathing and Patient connected to face mask  Post-op Assessment: Report given to RN and Post -op Vital signs reviewed and stable  Post vital signs: Reviewed and stable  Last Vitals:  Vitals Value Taken Time  BP 102/68 08/25/23 1645  Temp    Pulse 83 08/25/23 1646  Resp 17 08/25/23 1646  SpO2 96 % 08/25/23 1646  Vitals shown include unfiled device data.  Last Pain:  Vitals:   08/25/23 1441  TempSrc: Oral         Complications: No notable events documented.

## 2023-08-26 ENCOUNTER — Encounter (HOSPITAL_COMMUNITY): Payer: Self-pay | Admitting: Urology

## 2023-08-26 NOTE — Op Note (Signed)
 NAME: Todd Clayton, Todd R. MEDICAL RECORD NO: [id number] ACCOUNT NO: [id number] DATE OF BIRTH: Apr 13, 1971 FACILITY: [facility] LOCATION: [facility] PHYSICIAN: Osborn Blaze, MD  Operative Report   DATE OF PROCEDURE: 08/25/2023  PREOPERATIVE DIAGNOSIS:  Left ureteral stone with refractory colic.  POSTOPERATIVE DIAGNOSIS:  Left ureteral stone with refractory colic.  PROCEDURE PERFORMED: 1.  Cystoscopy, left retrograde pyelogram, interpretation. 2.  Left ureteroscopy with basketing of stone. 3.  Insertion of left ureteral stent.  ESTIMATED BLOOD LOSS:  Nil.  COMPLICATIONS:  None.  SPECIMEN:  Left ureteral stone for composition analysis.  FINDINGS: 1.  Mild hydronephrosis to left proximal ureteral stone. 2.  Successful basketing of left ureteral stone. 3.  Successful placement of left ureteral stent, proximal end in renal pelvis, distal end in urinary bladder with tether.  INDICATIONS:  The patient is a pleasant 53 year old man with a prior history of urolithiasis found on workup of colicky flank pain to have a relatively small 3 mm left proximal ureteral stone. By axial imaging in the Emergency Room recently, he underwent  a very appropriate trial of medical passage with alpha-blockers and pain medications; however, his colic remained quite severe. He was seen in the office earlier today and his colic remained very severe and minimally able to maintain hydration.  Options  were discussed including continued medical therapy versus shockwave lithotripsy versus ureteroscopy. He adamantly wished to proceed with ureteroscopy with goal of stone free. Informed consent was  obtained and placed in my record.  DESCRIPTION OF PROCEDURE:  The patient being himself verified, procedure being left ureteroscopic stone manipulation was confirmed.  Procedure time-out was performed.  Intravenous antibiotics were administered.  General LMA anesthesia introduced.  The  patient was placed into a low lithotomy  position.  A sterile field was created by prepping and draping the patient's penis,perineum and proximal thighs using iodine.  Cystourethroscopy performed using a 21-French rigid cystoscope with offset lens.   Inspection of the anterior and posterior urethra was unremarkable.  Inspection of bladder revealed no diverticula, calcifications, or papillary lesions.  Ureteral orifices were single.  The left ureteral orifice was cannulated with a 6-French end-hole  catheter and left retrograde pyelogram was obtained.  Left retrograde pyelogram demonstrated a single left ureter with single-system left kidney.  There was a filling defect in the proximal ureter consistent with known stone.  A 0.038 ZIPwire was advanced to lower pole, set aside as a safety wire.  An  8-French feeding tube was placed in the urinary bladder for pressure release, and semirigid ureteroscopy performed of the distal four-fifths of the left ureter alongside a separate sensor working wire.  At the upper reaches of the semirigid scope, the  stone in question was encountered. It was multifaceted. There was some significant mucosal edema surrounding this. However, given its smaller size, it did appear amenable to simple basketing. It was grasped with an Escape basket on its long axis and very  carefully navigated out in its entirety and set aside for composition analysis.  Given the somewhat impacted nature of the stone, it was felt that brief interval stenting with tether stent would be most prudent and a new 4.8 x 24 Contour type stent was  carefully placed using fluoroscopic guidance. Good proximal and distal planes were noted.  Tether was left in place, fashioned to the dorsum of the penis. The procedure was terminated. The patient tolerated the procedure well. No immediate periprocedural  complications. The patient was taken to the postanesthesia care in stable condition with  plan for discharge home.       PAA D: 08/25/2023 4:34:49  pm T: 08/26/2023 12:30:00 am  JOB: [id number]/ [id number]

## 2023-08-26 NOTE — Anesthesia Postprocedure Evaluation (Signed)
 Anesthesia Post Note  Patient: Todd Clayton  Procedure(s) Performed: CYSTOSCOPY/URETEROSCOPY/BASKETING OF STONE/STENT PLACEMENT (Left)     Patient location during evaluation: PACU Anesthesia Type: General Level of consciousness: awake and alert Pain management: pain level controlled Vital Signs Assessment: post-procedure vital signs reviewed and stable Respiratory status: spontaneous breathing, nonlabored ventilation and respiratory function stable Cardiovascular status: blood pressure returned to baseline and stable Postop Assessment: no apparent nausea or vomiting Anesthetic complications: no   No notable events documented.  Last Vitals:  Vitals:   08/25/23 1715 08/25/23 1800  BP: (!) 126/95 117/86  Pulse: 88 86  Resp: 16 18  Temp:  (!) 36.4 C  SpO2: 91% 94%    Last Pain:  Vitals:   08/25/23 1800  TempSrc:   PainSc: 0-No pain                 Erin Havers

## 2023-08-27 ENCOUNTER — Ambulatory Visit: Admitting: Gastroenterology
